# Patient Record
Sex: Female | Born: 1990 | Race: Black or African American | Hispanic: No | Marital: Single | Smoking: Former smoker
Health system: Southern US, Community
[De-identification: ages and names within clinical notes are randomized; demographics above are authoritative.]

## PROBLEM LIST (undated history)

## (undated) ENCOUNTER — Inpatient Hospital Stay (HOSPITAL_COMMUNITY): Payer: Self-pay

## (undated) DIAGNOSIS — R87629 Unspecified abnormal cytological findings in specimens from vagina: Secondary | ICD-10-CM

## (undated) HISTORY — PX: DILATION AND CURETTAGE OF UTERUS: SHX78

## (undated) HISTORY — PX: THERAPEUTIC ABORTION: SHX798

---
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
## 2010-07-01 LAB — URINALYSIS, ROUTINE W REFLEX MICROSCOPIC
Bilirubin Urine: NEGATIVE
Ketones, ur: NEGATIVE mg/dL
Nitrite: NEGATIVE
pH: 6 (ref 5.0–8.0)

## 2010-07-01 LAB — GC/CHLAMYDIA PROBE AMP, GENITAL: GC Probe Amp, Genital: NEGATIVE

## 2010-07-01 LAB — WET PREP, GENITAL
Trich, Wet Prep: NONE SEEN
Yeast Wet Prep HPF POC: NONE SEEN

## 2010-07-01 LAB — URINE MICROSCOPIC-ADD ON

## 2014-04-05 ENCOUNTER — Encounter (HOSPITAL_COMMUNITY): Payer: Self-pay | Admitting: Family Medicine

## 2014-04-05 ENCOUNTER — Emergency Department (HOSPITAL_COMMUNITY)
Admission: EM | Admit: 2014-04-05 | Discharge: 2014-04-05 | Disposition: A | Discharge status: Home / Self Care | Payer: No Typology Code available for payment source | Attending: Emergency Medicine | Admitting: Emergency Medicine

## 2014-04-05 DIAGNOSIS — Y9389 Activity, other specified: Secondary | ICD-10-CM | POA: Diagnosis not present

## 2014-04-05 DIAGNOSIS — S161XXA Strain of muscle, fascia and tendon at neck level, initial encounter: Secondary | ICD-10-CM | POA: Diagnosis not present

## 2014-04-05 DIAGNOSIS — Z720 Tobacco use: Secondary | ICD-10-CM | POA: Insufficient documentation

## 2014-04-05 DIAGNOSIS — S39012A Strain of muscle, fascia and tendon of lower back, initial encounter: Secondary | ICD-10-CM | POA: Diagnosis not present

## 2014-04-05 DIAGNOSIS — Y998 Other external cause status: Secondary | ICD-10-CM | POA: Insufficient documentation

## 2014-04-05 DIAGNOSIS — Y92410 Unspecified street and highway as the place of occurrence of the external cause: Secondary | ICD-10-CM | POA: Insufficient documentation

## 2014-04-05 DIAGNOSIS — S199XXA Unspecified injury of neck, initial encounter: Secondary | ICD-10-CM | POA: Diagnosis present

## 2014-04-05 MED ORDER — TRAMADOL HCL 50 MG PO TABS: 50.0000 mg | ORAL_TABLET | Freq: Four times a day (QID) | ORAL | Status: DC | PRN

## 2014-04-05 MED ORDER — TRAMADOL HCL 50 MG PO TABS
50.0000 mg | ORAL_TABLET | Freq: Once | ORAL | Status: AC
  Administered 2014-04-05: 50 mg via ORAL
  Filled 2014-04-05: qty 1

## 2014-04-05 MED ORDER — CYCLOBENZAPRINE HCL 10 MG PO TABS
10.0000 mg | ORAL_TABLET | Freq: Once | ORAL | Status: AC
  Administered 2014-04-05: 10 mg via ORAL
  Filled 2014-04-05: qty 1

## 2014-04-05 MED ORDER — CYCLOBENZAPRINE HCL 10 MG PO TABS: 10.0000 mg | ORAL_TABLET | Freq: Two times a day (BID) | ORAL | Status: DC | PRN
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
## 2014-04-05 NOTE — Discharge Instructions (Signed)
Take Tramadol as needed for pain. Take Flexeril as needed for muscle spasm. Refer to attached documents for more information.  °
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
## 2014-04-05 NOTE — ED Notes (Signed)
Declined W/C at D/C and was escorted to lobby by RN. 

## 2015-09-22 ENCOUNTER — Encounter: Payer: Self-pay | Admitting: Obstetrics and Gynecology

## 2015-09-22 ENCOUNTER — Ambulatory Visit (INDEPENDENT_AMBULATORY_CARE_PROVIDER_SITE_OTHER): Payer: Managed Care, Other (non HMO) | Admitting: Obstetrics and Gynecology

## 2015-09-22 VITALS — BP 100/56 | HR 84 | Resp 16 | Ht 67.5 in | Wt 134.0 lb

## 2015-09-22 DIAGNOSIS — Z124 Encounter for screening for malignant neoplasm of cervix: Secondary | ICD-10-CM

## 2015-09-22 DIAGNOSIS — Z23 Encounter for immunization: Secondary | ICD-10-CM

## 2015-09-22 DIAGNOSIS — Z113 Encounter for screening for infections with a predominantly sexual mode of transmission: Secondary | ICD-10-CM

## 2015-09-22 DIAGNOSIS — Z3009 Encounter for other general counseling and advice on contraception: Secondary | ICD-10-CM | POA: Diagnosis not present

## 2015-09-22 DIAGNOSIS — N9489 Other specified conditions associated with female genital organs and menstrual cycle: Secondary | ICD-10-CM | POA: Diagnosis not present

## 2015-09-22 DIAGNOSIS — Z01419 Encounter for gynecological examination (general) (routine) without abnormal findings: Secondary | ICD-10-CM | POA: Diagnosis not present

## 2015-09-22 DIAGNOSIS — N898 Other specified noninflammatory disorders of vagina: Secondary | ICD-10-CM

## 2015-09-22 DIAGNOSIS — E785 Hyperlipidemia, unspecified: Secondary | ICD-10-CM

## 2015-09-22 DIAGNOSIS — Z0000 Encounter for general adult medical examination without abnormal findings: Secondary | ICD-10-CM

## 2015-09-22 DIAGNOSIS — Z803 Family history of malignant neoplasm of breast: Secondary | ICD-10-CM | POA: Diagnosis not present

## 2015-09-22 LAB — LIPID PANEL
CHOL/HDL RATIO: 3 ratio (ref <=5.0)
CHOLESTEROL: 138 mg/dL (ref 125–200)
HDL: 46 mg/dL (ref >=46)
LDL Cholesterol: 77 mg/dL (ref <130)
Triglycerides: 75 mg/dL (ref <150)
VLDL: 15 mg/dL (ref <30)

## 2015-09-22 NOTE — Patient Instructions (Addendum)
Breast Self-Awareness Practicing breast self-awareness may pick up problems early, prevent significant medical complications, and possibly save your life. By practicing breast self-awareness, you can become familiar with how your breasts look and feel and if your breasts are changing. This allows you to notice changes early. It can also offer you some reassurance that your breast health is good. One way to learn what is normal for your breasts and whether your breasts are changing is to do a breast self-exam. If you find a lump or something that was not present in the past, it is best to contact your caregiver right away. Other findings that should be evaluated by your caregiver include nipple discharge, especially if it is bloody; skin changes or reddening; areas where the skin seems to be pulled in (retracted); or new lumps and bumps. Breast pain is seldom associated with cancer (malignancy), but should also be evaluated by a caregiver. HOW TO PERFORM A BREAST SELF-EXAM The best time to examine your breasts is 5-7 days after your menstrual period is over. During menstruation, the breasts are lumpier, and it may be more difficult to pick up changes. If you do not menstruate, have reached menopause, or had your uterus removed (hysterectomy), you should examine your breasts at regular intervals, such as monthly. If you are breastfeeding, examine your breasts after a feeding or after using a breast pump. Breast implants do not decrease the risk for lumps or tumors, so continue to perform breast self-exams as recommended. Talk to your caregiver about how to determine the difference between the implant and breast tissue. Also, talk about the amount of pressure you should use during the exam. Over time, you will become more familiar with the variations of your breasts and more comfortable with the exam. A breast self-exam requires you to remove all your clothes above the waist.  Look at your breasts and nipples.  Stand in front of a mirror in a room with good lighting. With your hands on your hips, push your hands firmly downward. Look for a difference in shape, contour, and size from one breast to the other (asymmetry). Asymmetry includes puckers, dips, or bumps. Also, look for skin changes, such as reddened or scaly areas on the breasts. Look for nipple changes, such as discharge, dimpling, repositioning, or redness.  Carefully feel your breasts. This is best done either in the shower or tub while using soapy water or when flat on your back. Place the arm (on the side of the breast you are examining) above your head. Use the pads (not the fingertips) of your three middle fingers on your opposite hand to feel your breasts. Start in the underarm area and use  inch (2 cm) overlapping circles to feel your breast. Use 3 different levels of pressure (light, medium, and firm pressure) at each circle before moving to the next circle. The light pressure is needed to feel the tissue closest to the skin. The medium pressure will help to feel breast tissue a little deeper, while the firm pressure is needed to feel the tissue close to the ribs. Continue the overlapping circles, moving downward over the breast until you feel your ribs below your breast. Then, move one finger-width towards the center of the body. Continue to use the  inch (2 cm) overlapping circles to feel your breast as you move slowly up toward the collar bone (clavicle) near the base of the neck. Continue the up and down exam using all 3 pressures until you reach the  middle of the chest. Do this with each breast, carefully feeling for lumps or changes.   Keep a written record with breast changes or normal findings for each breast. By writing this information down, you do not need to depend only on memory for size, tenderness, or location. Write down where you are in your menstrual cycle, if you are still menstruating. Breast tissue can have some lumps or thick  tissue. However, see your caregiver if you find anything that concerns you.  SEEK MEDICAL CARE IF:  You see a change in shape, contour, or size of your breasts or nipples.   You see skin changes, such as reddened or scaly areas on the breasts or nipples.   You have an unusual discharge from your nipples.   You feel a new lump or unusually thick areas.    This information is not intended to replace advice given to you by your health care provider. Make sure you discuss any questions you have with your health care provider.   Document Released: 04/02/2005 Document Revised: 03/19/2012 Document Reviewed: 07/18/2011 Elsevier Interactive Patient Education 2016 [employer]. Medroxyprogesterone injection [Contraceptive] What is this medicine? MEDROXYPROGESTERONE (me DROX ee proe JES te rone) contraceptive injections prevent pregnancy. They provide effective birth control for 3 months. Depo-subQ Provera 104 is also used for treating pain related to endometriosis. This medicine may be used for other purposes; ask your health care provider or pharmacist if you have questions. What should I tell my health care provider before I take this medicine? They need to know if you have any of these conditions: -frequently drink alcohol -asthma -blood vessel disease or a history of a blood clot in the lungs or legs -bone disease such as osteoporosis -breast cancer -diabetes -eating disorder (anorexia nervosa or bulimia) -high blood pressure -HIV infection or AIDS -kidney disease -liver disease -mental depression -migraine -seizures (convulsions) -stroke -tobacco smoker -vaginal bleeding -an unusual or allergic reaction to medroxyprogesterone, other hormones, medicines, foods, dyes, or preservatives -pregnant or trying to get pregnant -breast-feeding How should I use this medicine? Depo-Provera Contraceptive injection is given into a muscle. Depo-subQ Provera 104 injection is given under  the skin. These injections are given by a health care professional. You must not be pregnant before getting an injection. The injection is usually given during the first 5 days after the start of a menstrual period or 6 weeks after delivery of a baby. Talk to your pediatrician regarding the use of this medicine in children. Special care may be needed. These injections have been used in female children who have started having menstrual periods. Overdosage: If you think you have taken too much of this medicine contact a poison control center or emergency room at once. NOTE: This medicine is only for you. Do not share this medicine with others. What if I miss a dose? Try not to miss a dose. You must get an injection once every 3 months to maintain birth control. If you cannot keep an appointment, call and reschedule it. If you wait longer than 13 weeks between Depo-Provera contraceptive injections or longer than 14 weeks between Depo-subQ Provera 104 injections, you could get pregnant. Use another method for birth control if you miss your appointment. You may also need a pregnancy test before receiving another injection. What may interact with this medicine? Do not take this medicine with any of the following medications: -bosentan This medicine may also interact with the following medications: -aminoglutethimide -antibiotics or medicines for infections, especially rifampin,  rifabutin, rifapentine, and griseofulvin -aprepitant -barbiturate medicines such as phenobarbital or primidone -bexarotene -carbamazepine -medicines for seizures like ethotoin, felbamate, oxcarbazepine, phenytoin, topiramate -modafinil -St. John's wort This list may not describe all possible interactions. Give your health care provider a list of all the medicines, herbs, non-prescription drugs, or dietary supplements you use. Also tell them if you smoke, drink alcohol, or use illegal drugs. Some items may interact with your  medicine. What should I watch for while using this medicine? This drug does not protect you against HIV infection (AIDS) or other sexually transmitted diseases. Use of this product may cause you to lose calcium from your bones. Loss of calcium may cause weak bones (osteoporosis). Only use this product for more than 2 years if other forms of birth control are not right for you. The longer you use this product for birth control the more likely you will be at risk for weak bones. Ask your health care professional how you can keep strong bones. You may have a change in bleeding pattern or irregular periods. Many females stop having periods while taking this drug. If you have received your injections on time, your chance of being pregnant is very low. If you think you may be pregnant, see your health care professional as soon as possible. Tell your health care professional if you want to get pregnant within the next year. The effect of this medicine may last a long time after you get your last injection. What side effects may I notice from receiving this medicine? Side effects that you should report to your doctor or health care professional as soon as possible: -allergic reactions like skin rash, itching or hives, swelling of the face, lips, or tongue -breast tenderness or discharge -breathing problems -changes in vision -depression -feeling faint or lightheaded, falls -fever -pain in the abdomen, chest, groin, or leg -problems with balance, talking, walking -unusually weak or tired -yellowing of the eyes or skin Side effects that usually do not require medical attention (report to your doctor or health care professional if they continue or are bothersome): -acne -fluid retention and swelling -headache -irregular periods, spotting, or absent periods -temporary pain, itching, or skin reaction at site where injected -weight gain This list may not describe all possible side effects. Call your  doctor for medical advice about side effects. You may report side effects to FDA at 1-800-FDA-1088. Where should I keep my medicine? This does not apply. The injection will be given to you by a health care professional. NOTE: This sheet is a summary. It may not cover all possible information. If you have questions about this medicine, talk to your doctor, pharmacist, or health care provider.    2016, Elsevier/Gold Standard. (2008-04-23 18:37:56) Bacterial Vaginosis Bacterial vaginosis is a vaginal infection that occurs when the normal balance of bacteria in the vagina is disrupted. It results from an overgrowth of certain bacteria. This is the most common vaginal infection in women of childbearing age. Treatment is important to prevent complications, especially in pregnant women, as it can cause a premature delivery. CAUSES  Bacterial vaginosis is caused by an increase in harmful bacteria that are normally present in smaller amounts in the vagina. Several different kinds of bacteria can cause bacterial vaginosis. However, the reason that the condition develops is not fully understood. RISK FACTORS Certain activities or behaviors can put you at an increased risk of developing bacterial vaginosis, including:  Having a new sex partner or multiple sex partners.  Douching.  Using  an intrauterine device (IUD) for contraception. Women do not get bacterial vaginosis from toilet seats, bedding, swimming pools, or contact with objects around them. SIGNS AND SYMPTOMS  Some women with bacterial vaginosis have no signs or symptoms. Common symptoms include:  Grey vaginal discharge.  A fishlike odor with discharge, especially after sexual intercourse.  Itching or burning of the vagina and vulva.  Burning or pain with urination. DIAGNOSIS  Your health care provider will take a medical history and examine the vagina for signs of bacterial vaginosis. A sample of vaginal fluid may be taken. Your health  care provider will look at this sample under a microscope to check for bacteria and abnormal cells. A vaginal pH test may also be done.  TREATMENT  Bacterial vaginosis may be treated with antibiotic medicines. These may be given in the form of a pill or a vaginal cream. A second round of antibiotics may be prescribed if the condition comes back after treatment. Because bacterial vaginosis increases your risk for sexually transmitted diseases, getting treated can help reduce your risk for chlamydia, gonorrhea, HIV, and herpes. HOME CARE INSTRUCTIONS   Only take over-the-counter or prescription medicines as directed by your health care provider.  If antibiotic medicine was prescribed, take it as directed. Make sure you finish it even if you start to feel better.  Tell all sexual partners that you have a vaginal infection. They should see their health care provider and be treated if they have problems, such as a mild rash or itching.  During treatment, it is important that you follow these instructions:  Avoid sexual activity or use condoms correctly.  Do not douche.  Avoid alcohol as directed by your health care provider.  Avoid breastfeeding as directed by your health care provider. SEEK MEDICAL CARE IF:   Your symptoms are not improving after 3 days of treatment.  You have increased discharge or pain.  You have a fever. MAKE SURE YOU:   Understand these instructions.  Will watch your condition.  Will get help right away if you are not doing well or get worse. FOR MORE INFORMATION  Centers for Disease Control and Prevention, Division of STD Prevention: SolutionApps.co.zawww.cdc.gov/std American Sexual Health Association (ASHA): www.ashastd.org    This information is not intended to replace advice given to you by your health care provider. Make sure you discuss any questions you have with your health care provider.   Document Released: 04/02/2005 Document Revised: 04/23/2014 Document Reviewed:  11/12/2012 Elsevier Interactive Patient Education [employer]2016 Elsevier Inc.

## 2015-09-22 NOTE — Progress Notes (Signed)
Patient ID: Brooke Vance, female   DOB: Feb 21, 1991, 25 y.o.   MRN: [id number] 25 y.o. G1P0010 Single African AmericanF here for annual exam.  She c/o a vaginal odor since last week. Slight itching, no burning or irritation.  Period Duration (Days): 5 days  Period Pattern: Regular Menstrual Flow: Moderate Menstrual Control: Maxi pad, Tampon Dysmenorrhea: None  Saturates a pad in 3 hours. Rare BTB, spotting. Same partner for over 1.5 years, sometimes uses condoms. She was on OCP's in the past, did fine on them.  She does report a h/o having elevated lipids in the past, no f/u.  Patient's last menstrual period was 09/01/2015.          Sexually active: Yes.    The current method of family planning is condoms sometimes.    Exercising: No.  The patient does not participate in regular exercise at present. Smoker:  yes  Health Maintenance: Pap:  2014 abnormal per patient  History of abnormal Pap:  Yes, f/u was fine. TDaP:  unsure Gardasil: unsure, thinks she had 2 of them   reports that she has been smoking Cigarettes.  She has been smoking about 0.25 packs per day. She has never used smokeless tobacco. She reports that she drinks about 2.4 oz of alcohol per week. She reports that she does not use illicit drugs.She is an Primary school teacher. Lives with her boyfriend.   History reviewed. No pertinent past medical history.  Past Surgical History  Procedure Laterality Date  . Dilation and curettage of uterus    TAB was 3 years ago.   No current outpatient prescriptions on file.   No current facility-administered medications for this visit.    Family History  Problem Relation Age of Onset  . Breast cancer Mother 58    BRCA negative. Doing fine  . Heart disease Paternal Grandmother     Review of Systems  Constitutional: Negative.   HENT: Negative.   Eyes: Negative.   Respiratory: Negative.   Cardiovascular: Negative.   Gastrointestinal: Negative.   Endocrine: Negative.    Genitourinary: Negative.   Musculoskeletal: Negative.   Skin: Negative.   Allergic/Immunologic: Negative.   Neurological: Negative.   Psychiatric/Behavioral: Negative.     Exam:   BP 100/56 mmHg  Pulse 84  Resp 16  Ht 5' 7.5" (1.715 m)  Wt 134 lb (60.782 kg)  BMI 20.67 kg/m2  LMP 09/01/2015  Weight change: @WEIGHTCHANGE @ Height:   Height: 5' 7.5" (171.5 cm)  Ht Readings from Last 3 Encounters:  09/22/15 5' 7.5" (1.715 m)  04/05/14 5' 7"  (1.702 m)    General appearance: alert, cooperative and appears stated age Head: Normocephalic, without obvious abnormality, atraumatic Neck: no adenopathy, supple, symmetrical, trachea midline and thyroid normal to inspection and palpation Lungs: clear to auscultation bilaterally Breasts: normal appearance, no masses or tenderness Heart: regular rate and rhythm Abdomen: soft, non-tender; bowel sounds normal; no masses,  no organomegaly Extremities: extremities normal, atraumatic, no cyanosis or edema Skin: Skin color, texture, turgor normal. No rashes or lesions Lymph nodes: Cervical, supraclavicular, and axillary nodes normal. No abnormal inguinal nodes palpated Neurologic: Grossly normal   Pelvic: External genitalia:  no lesions              Urethra:  normal appearing urethra with no masses, tenderness or lesions              Bartholins and Skenes: normal  Vagina: normal appearing vagina with an increased watery vaginal discharge              Cervix: no lesions               Bimanual Exam:  Uterus:  normal size, contour, position, consistency, mobility, non-tender              Adnexa: no mass, fullness, tenderness               Rectovaginal: Confirms               Anus:  normal sphincter tone, no lesions  Chaperone was present for exam.  A:  Well Woman with normal exam   Contraception, discussed options of contraception, she is interested in depoprovera. Discussed side effects of Depo-provera, including bleeding,  bone loss and weight gain  STD testing  Family history of breast cancer. Mother with breast cancer at 65, negative BRCA testing  Vaginal odor, increased d/c on exam, suspect BV  P:   Pap  STD testing  Lipid panel, vit D  Discussed breast self exam  Discussed calcium and vit D intake  Consider genetic counseling   Start depo-provera with her next cycle, 150 mg IM q 12 weeks  She will check on her gardasil vaccinations. If needs more will do at her f/u visit for depo-provera

## 2015-09-23 ENCOUNTER — Telehealth: Payer: Self-pay

## 2015-09-23 DIAGNOSIS — R7989 Other specified abnormal findings of blood chemistry: Secondary | ICD-10-CM

## 2015-09-23 LAB — WET PREP BY MOLECULAR PROBE
Candida species: NEGATIVE
Gardnerella vaginalis: POSITIVE — AB
Trichomonas vaginosis: NEGATIVE

## 2015-09-23 LAB — STD PANEL
HEP B S AG: NEGATIVE
HIV 1&2 Ab, 4th Generation: NONREACTIVE

## 2015-09-23 LAB — HEPATITIS C ANTIBODY: HCV Ab: NEGATIVE

## 2015-09-23 LAB — VITAMIN D 25 HYDROXY (VIT D DEFICIENCY, FRACTURES): VIT D 25 HYDROXY: 12 ng/mL — AB (ref 30–100)

## 2015-09-23 LAB — IPS PAP SMEAR ONLY

## 2015-09-23 MED ORDER — VITAMIN D (ERGOCALCIFEROL) 1.25 MG (50000 UNIT) PO CAPS: 50000.0000 [IU] | ORAL_CAPSULE | ORAL | Status: DC

## 2015-09-23 MED ORDER — METRONIDAZOLE 0.75 % VA GEL: 1.0000 | Freq: Every day | VAGINAL | Status: DC

## 2015-09-23 NOTE — Telephone Encounter (Signed)
Spoke with patient. Advised of results and message as seen below from Dr.Jertson. She is agreeable and verbalizes understanding. She would like to use Metrogel at this time. Rx for Metrogel 1 applicator per vagina q day x 5 days and Vitamin D 50,000 IU take 1 tablet every 7 days #12 0RF sent to pharmacy on file. She is agreeable. 3 month lab recheck scheduled for 12/27/2015 at 8:30 am. She is agreeable to date and time.  Routing to provider for final review. Patient agreeable to disposition. Will close encounter.

## 2015-09-23 NOTE — Telephone Encounter (Signed)
-----   Message from Romualdo BolkJill Evelyn Jertson, MD sent at 09/23/2015  9:00 AM EDT ----- Please inform the patient that her vaginitis probe was + for BV and treat with flagyl (either oral or vaginal, her choice), no ETOH while on Flagyl.  Oral: Flagyl 500 mg BID x 7 days, or Vaginal: Metrogel, 1 applicator per vagina q day x 5 days. She also has a very low vit D. Please call in 50,000 IU of vit d q week x 12 weeks, then she should have another vit D level checked.  The rest of her blood work was normal. Her GC/Chlamydia culture and pap smear are still pending.

## 2015-09-26 LAB — IPS N GONORRHOEA AND CHLAMYDIA BY PCR

## 2015-12-12 ENCOUNTER — Other Ambulatory Visit: Payer: Self-pay | Admitting: Obstetrics and Gynecology

## 2015-12-12 NOTE — Telephone Encounter (Signed)
Medication refill request: Vitamin D 50,000 Last AEX:  09-22-15  Next AEX: 09-26-16 Last MMG (if hormonal medication request): N/A Refill authorized: Please advise- patient is scheduled for 12-27-15 for Vitamin D recheck-eh

## 2015-12-12 NOTE — Telephone Encounter (Signed)
Patients phone does not allow for voice mail-eh

## 2015-12-12 NOTE — Telephone Encounter (Signed)
I would not fill her vit D script until she has a f/u vit d level. It sounds like she has an appointment  In several weeks, see if we can move that up. Thanks

## 2015-12-27 ENCOUNTER — Ambulatory Visit (INDEPENDENT_AMBULATORY_CARE_PROVIDER_SITE_OTHER): Payer: Managed Care, Other (non HMO)

## 2015-12-27 ENCOUNTER — Other Ambulatory Visit (INDEPENDENT_AMBULATORY_CARE_PROVIDER_SITE_OTHER): Payer: Managed Care, Other (non HMO)

## 2015-12-27 VITALS — BP 114/78 | HR 64 | Resp 14 | Ht 67.5 in | Wt 133.6 lb

## 2015-12-27 DIAGNOSIS — E559 Vitamin D deficiency, unspecified: Secondary | ICD-10-CM

## 2015-12-27 DIAGNOSIS — Z01812 Encounter for preprocedural laboratory examination: Secondary | ICD-10-CM | POA: Diagnosis not present

## 2015-12-27 LAB — POCT URINE PREGNANCY: Preg Test, Ur: NEGATIVE

## 2015-12-27 NOTE — Progress Notes (Signed)
Patient here for UPT prior to starting Depo-Provera. Patient is on her cycle today.

## 2015-12-28 ENCOUNTER — Telehealth: Payer: Self-pay | Admitting: *Deleted

## 2015-12-28 DIAGNOSIS — E559 Vitamin D deficiency, unspecified: Secondary | ICD-10-CM

## 2015-12-28 LAB — VITAMIN D 25 HYDROXY (VIT D DEFICIENCY, FRACTURES): Vit D, 25-Hydroxy: 26 ng/mL — ABNORMAL LOW (ref 30–100)

## 2015-12-28 NOTE — Telephone Encounter (Signed)
-----   Message from Romualdo BolkJill Evelyn Jertson, MD sent at 12/28/2015 12:58 PM EDT ----- Please inform the patient that her vit d is still low, but much better. I would recommend she start taking 2,000 IU of vit D 3 daily (over the counter) and that we recheck her vit D level in another 3 months (please order and set up). Thanks

## 2016-01-10 ENCOUNTER — Ambulatory Visit: Payer: Managed Care, Other (non HMO)

## 2016-01-10 NOTE — Progress Notes (Deleted)
Patient is here for Depo Provera Injection Patient is within Depo Provera Calender Limits n/a Next Depo Due between: 12/12 - 12/26 Last AEX: 09/22/15 JJ AEX Scheduled: 09/26/16 JJ  Patient is aware when next depo is due  Pt tolerated Injection well. Injection given in   Routed to provider for review, encounter closed.

## 2016-03-07 ENCOUNTER — Telehealth: Payer: Self-pay | Admitting: Obstetrics and Gynecology

## 2016-03-07 DIAGNOSIS — Z3201 Encounter for pregnancy test, result positive: Secondary | ICD-10-CM

## 2016-03-07 DIAGNOSIS — N912 Amenorrhea, unspecified: Secondary | ICD-10-CM

## 2016-03-07 NOTE — Telephone Encounter (Signed)
Patient just learned that she is pregnant and like an appointment for confirmation.

## 2016-03-07 NOTE — Telephone Encounter (Signed)
Can we set her up for an ultrasound that day?

## 2016-03-07 NOTE — Telephone Encounter (Signed)
Spoke with patient. Patient is agreeable to schedule viability ultrasound. Appointment changed to viability ultrasound at 3:30 pm with 4 pm consult with Dr.Jertson on 03/13/2016. Patient is agreeable and verbalizes understanding. Order placed for precert.  Routing to provider for final review. Patient agreeable to disposition. Will close encounter.

## 2016-03-07 NOTE — Addendum Note (Signed)
Addended by: Michele McalpineHINES, KAITLYN E on: 03/07/2016 02:00 PM   Modules accepted: Orders

## 2016-03-07 NOTE — Telephone Encounter (Signed)
Spoke with patient. Patient's LMP was 01/26/2016. Took UPT that was positive. Would like to be seen for confirmation. Appointment scheduled for 03/13/2016 at 3:30 pm with Dr.Jertson. Patient is agreeable to date and time.  Routing to provider for final review. Patient agreeable to disposition. Will close encounter.

## 2016-03-13 ENCOUNTER — Ambulatory Visit (INDEPENDENT_AMBULATORY_CARE_PROVIDER_SITE_OTHER): Payer: Managed Care, Other (non HMO)

## 2016-03-13 ENCOUNTER — Ambulatory Visit (INDEPENDENT_AMBULATORY_CARE_PROVIDER_SITE_OTHER): Payer: Managed Care, Other (non HMO) | Admitting: Obstetrics and Gynecology

## 2016-03-13 ENCOUNTER — Ambulatory Visit: Payer: Managed Care, Other (non HMO) | Admitting: Obstetrics and Gynecology

## 2016-03-13 ENCOUNTER — Encounter: Payer: Self-pay | Admitting: Obstetrics and Gynecology

## 2016-03-13 VITALS — BP 110/60 | HR 80 | Resp 14 | Wt 130.0 lb

## 2016-03-13 DIAGNOSIS — R112 Nausea with vomiting, unspecified: Secondary | ICD-10-CM | POA: Diagnosis not present

## 2016-03-13 DIAGNOSIS — Z3201 Encounter for pregnancy test, result positive: Secondary | ICD-10-CM | POA: Diagnosis not present

## 2016-03-13 DIAGNOSIS — N912 Amenorrhea, unspecified: Secondary | ICD-10-CM

## 2016-03-13 DIAGNOSIS — O30041 Twin pregnancy, dichorionic/diamniotic, first trimester: Secondary | ICD-10-CM | POA: Diagnosis not present

## 2016-03-13 MED ORDER — PROMETHAZINE HCL 25 MG RE SUPP: 25.0000 mg | Freq: Four times a day (QID) | RECTAL | 0 refills | Status: DC | PRN

## 2016-03-13 NOTE — Progress Notes (Signed)
GYNECOLOGY  VISIT   HPI: 25 y.o.   Single  African American  female   G2P0010 with Patient's last menstrual period was 12/25/2015.   here for viability U/S. Patient c/o nausea and vomiting. Today vomited 4 x's, having a hard time keeping anything down.       GYNECOLOGIC HISTORY: Patient's last menstrual period was 12/25/2015. Contraception:none  Menopausal hormone therapy: none        OB History    Gravida Para Term Preterm AB Living   2       1     SAB TAB Ectopic Multiple Live Births                     There are no active problems to display for this patient.   No past medical history on file.  Past Surgical History:  Procedure Laterality Date  . DILATION AND CURETTAGE OF UTERUS     abortion     No current outpatient prescriptions on file.   No current facility-administered medications for this visit.      ALLERGIES: Patient has no known allergies.  Family History  Problem Relation Age of Onset  . Breast cancer Mother 65    BRCA negative. Doing fine  . Heart disease Paternal Grandmother     Social History   Social History  . Marital status: Single    Spouse name: N/A  . Number of children: N/A  . Years of education: N/A   Occupational History  . Not on file.   Social History Main Topics  . Smoking status: Current Every Day Smoker    Packs/day: 0.25    Types: Cigarettes  . Smokeless tobacco: Never Used  . Alcohol use 2.4 oz/week    4 Standard drinks or equivalent per week  . Drug use: No  . Sexual activity: Yes    Partners: Male   Other Topics Concern  . Not on file   Social History Narrative  . No narrative on file    Review of Systems  Constitutional: Negative.   HENT: Negative.   Eyes: Negative.   Respiratory: Negative.   Cardiovascular: Negative.   Gastrointestinal: Positive for nausea.  Genitourinary: Negative.   Musculoskeletal: Negative.   Skin: Negative.   Neurological: Negative.   Endo/Heme/Allergies:  Negative.   Psychiatric/Behavioral: Negative.     PHYSICAL EXAMINATION:    BP 110/60 (BP Location: Right Arm, Patient Position: Sitting, Cuff Size: Normal)   Pulse 80   Resp 14   Wt 130 lb (59 kg)   LMP 12/25/2015   BMI 20.06 kg/m     General appearance: alert, cooperative and appears stated age  ASSESSMENT Twin IUP, di/di N/V in pregnacy    PLAN Discussed N/V in pregnancy Try unisom and vit B6 Phenergan as back up  Needs to go to the ER at [facility] if she still can't tolerate PO Discussed PNV She will establish care with OB    An After Visit Summary was printed and given to the patient.  15 minutes face to face time of which over 50% was spent in counseling.

## 2016-03-13 NOTE — Patient Instructions (Signed)
Try vit B6 and unisom  Eating Plan for Hyperemesis Gravidarum Hyperemesis gravidarum is a severe form of morning sickness. Because this condition causes severe nausea and vomiting, it can lead to dehydration, malnutrition, and weight loss. One way to lessen the symptoms of nausea and vomiting is to follow the eating plan for hyperemesis gravidarum. It is often used along with prescribed medicines to control your symptoms. What can I do to relieve my symptoms? Listen to your body. Everyone is different and has different preferences. Find what works best for you. Take any of the following actions that are helpful to you:  Eat and drink slowly.  Eat 5-6 small meals daily instead of 3 large meals.  Eat crackers before you get out of bed in the morning.  Try having a snack in the middle of the night.  Starchy foods are usually tolerated well. Examples include cereal, toast, bread, potatoes, pasta, rice, and pretzels.  Ginger may help with nausea. Add  tsp ground ginger to hot tea or choose ginger tea.  Try drinking 100% fruit juice or an electrolyte drink. An electrolyte drink contains sodium, potassium, and chloride.  Continue to take your prenatal vitamins as told by your health care provider. If you are having trouble taking your prenatal vitamins, talk with your health care provider about different options.  Include at least 1 serving of protein with your meals and snacks. Protein options include meats or poultry, beans, nuts, eggs, and yogurt. Try eating a protein-rich snack before bed. Examples of these snacks include cheese and crackers or half of a peanut butter or [place] sandwich.  Consider eliminating foods that trigger your symptoms. These may include spicy foods, coffee, high-fat foods, very sweet foods, and acidic foods.  Try meals that have more protein combined with bland, salty, lower-fat, and dry foods, such as nuts, seeds, pretzels, crackers, and cereal.  Talk with your  healthcare provider about starting a supplement of vitamin B6.  Have fluids that are cold, clear, and carbonated or sour. Examples include lemonade, ginger ale, lemon-lime soda, ice water, and sparkling water.  Try lemon or mint tea.  Try brushing your teeth or using a mouth rinse after meals. What should I avoid to reduce my symptoms? Avoiding some of the following things may help reduce your symptoms.  Foods with strong smells. Try eating meals in well-ventilated areas that are free of odors.  Drinking water or other beverages with meals. Try not to drink anything during the 30 minutes before and after your meals.  Drinking more than 1 cup of fluid at a time. Sometimes using a straw helps.  Fried or high-fat foods, such as butter and cream sauces.  Spicy foods.  Skipping meals as best as you can. Nausea can be more intense on an empty stomach. If you cannot tolerate food at that time, do not force it. Try sucking on ice chips or other frozen items, and make up for missed calories later.  Lying down within 2 hours after eating.  Environmental triggers. These may include smoky rooms, closed spaces, rooms with strong smells, warm or humid places, overly loud and noisy rooms, and rooms with motion or flickering lights.  Quick and sudden changes in your movement. This information is not intended to replace advice given to you by your health care provider. Make sure you discuss any questions you have with your health care provider. Document Released: 01/28/2007 Document Revised: 11/30/2015 Document Reviewed: 11/01/2015 Elsevier Interactive Patient Education  2017 [employer]

## 2016-03-23 LAB — OB RESULTS CONSOLE GC/CHLAMYDIA: GC PROBE AMP, GENITAL: NEGATIVE

## 2016-03-26 LAB — OB RESULTS CONSOLE RUBELLA ANTIBODY, IGM: Rubella: IMMUNE

## 2016-03-26 LAB — OB RESULTS CONSOLE RPR: RPR: NONREACTIVE

## 2016-03-26 LAB — OB RESULTS CONSOLE HEPATITIS B SURFACE ANTIGEN: HEP B S AG: NEGATIVE

## 2016-03-26 LAB — OB RESULTS CONSOLE HIV ANTIBODY (ROUTINE TESTING): HIV: NONREACTIVE

## 2016-03-28 ENCOUNTER — Telehealth: Payer: Self-pay | Admitting: Obstetrics and Gynecology

## 2016-03-28 ENCOUNTER — Other Ambulatory Visit: Payer: Self-pay

## 2016-03-28 NOTE — Telephone Encounter (Signed)
Called patient to reschedule lab appointment from this morning. Patient is pregnant and is currently seeing an obgyn.

## 2016-04-12 ENCOUNTER — Encounter (HOSPITAL_COMMUNITY): Payer: Self-pay | Admitting: *Deleted

## 2016-04-12 ENCOUNTER — Inpatient Hospital Stay (HOSPITAL_COMMUNITY)
Admission: AD | Admit: 2016-04-12 | Discharge: 2016-04-12 | Disposition: A | Discharge status: Home / Self Care | Payer: Managed Care, Other (non HMO) | Source: Ambulatory Visit | Attending: Obstetrics and Gynecology | Admitting: Obstetrics and Gynecology

## 2016-04-12 DIAGNOSIS — Z803 Family history of malignant neoplasm of breast: Secondary | ICD-10-CM | POA: Insufficient documentation

## 2016-04-12 DIAGNOSIS — O30041 Twin pregnancy, dichorionic/diamniotic, first trimester: Secondary | ICD-10-CM | POA: Diagnosis not present

## 2016-04-12 DIAGNOSIS — Z87891 Personal history of nicotine dependence: Secondary | ICD-10-CM | POA: Diagnosis not present

## 2016-04-12 DIAGNOSIS — O30049 Twin pregnancy, dichorionic/diamniotic, unspecified trimester: Secondary | ICD-10-CM | POA: Diagnosis not present

## 2016-04-12 DIAGNOSIS — O219 Vomiting of pregnancy, unspecified: Secondary | ICD-10-CM | POA: Insufficient documentation

## 2016-04-12 DIAGNOSIS — Z3A11 11 weeks gestation of pregnancy: Secondary | ICD-10-CM | POA: Diagnosis not present

## 2016-04-12 DIAGNOSIS — R112 Nausea with vomiting, unspecified: Secondary | ICD-10-CM | POA: Diagnosis present

## 2016-04-12 HISTORY — DX: Unspecified abnormal cytological findings in specimens from vagina: R87.629

## 2016-04-12 LAB — URINALYSIS, ROUTINE W REFLEX MICROSCOPIC
Bilirubin Urine: NEGATIVE
Glucose, UA: NEGATIVE mg/dL
HGB URINE DIPSTICK: NEGATIVE
Ketones, ur: 80 mg/dL — AB
Leukocytes, UA: NEGATIVE
Nitrite: NEGATIVE
PROTEIN: 30 mg/dL — AB
Specific Gravity, Urine: 1.026 (ref 1.005–1.030)
pH: 6 (ref 5.0–8.0)

## 2016-04-12 MED ORDER — PANTOPRAZOLE SODIUM 40 MG IV SOLR
40.0000 mg | Freq: Once | INTRAVENOUS | Status: AC
  Administered 2016-04-12: 40 mg via INTRAVENOUS
  Filled 2016-04-12: qty 40

## 2016-04-12 MED ORDER — DEXTROSE IN LACTATED RINGERS 5 % IV SOLN
25.0000 mg | Freq: Once | INTRAVENOUS | Status: AC
  Administered 2016-04-12: 25 mg via INTRAVENOUS
  Filled 2016-04-12: qty 1

## 2016-04-12 MED ORDER — PROMETHAZINE HCL 25 MG RE SUPP: 25.0000 mg | Freq: Four times a day (QID) | RECTAL | 1 refills | Status: DC | PRN

## 2016-04-12 MED ORDER — LACTATED RINGERS IV BOLUS (SEPSIS)
1000.0000 mL | Freq: Once | INTRAVENOUS | Status: AC
  Administered 2016-04-12: 1000 mL via INTRAVENOUS

## 2016-04-12 NOTE — MAU Provider Note (Signed)
Chief Complaint: No chief complaint on file.   First Provider Initiated Contact with Patient 04/12/16 1955     SUBJECTIVE HPI: Brooke Vance is a 25 y.o. G2P0010 at 80w4dwho presents to Maternity Admissions reporting exacerbation of nausea and vomiting of pregnancy. Said nausea and vomiting throughout pregnancy, but symptoms have been adequately controlled with Diclegis and Phenergan up until this point, but today she vomited 8-10 times and couldn't keep anything down including medications.   Associated signs and symptoms: Negative for fever, chills, diarrhea, abdominal pain or sick contacts.  Past Medical History:  Diagnosis Date  . Vaginal Pap smear, abnormal    OB History  Gravida Para Term Preterm AB Living  2       1    SAB TAB Ectopic Multiple Live Births               # Outcome Date GA Lbr Len/2nd Weight Sex Delivery Anes PTL Lv  2 Current           1 AB              Past Surgical History:  Procedure Laterality Date  . DILATION AND CURETTAGE OF UTERUS     abortion   . THERAPEUTIC ABORTION     Social History   Social History  . Marital status: Single    Spouse name: N/A  . Number of children: N/A  . Years of education: N/A   Occupational History  . Not on file.   Social History Main Topics  . Smoking status: Former Smoker    Packs/day: 0.25    Types: Cigarettes    Quit date: 03/29/2016  . Smokeless tobacco: Never Used  . Alcohol use 2.4 oz/week    4 Standard drinks or equivalent per week     Comment: not while preg  . Drug use: No  . Sexual activity: Yes    Partners: Male     Comment: last sex Dec 25   Other Topics Concern  . Not on file   Social History Narrative  . No narrative on file   Family History  Problem Relation Age of Onset  . Breast cancer Mother 337   BRCA negative. Doing fine  . Heart disease Paternal Grandmother    No current facility-administered medications on file prior to encounter.    No current outpatient  prescriptions on file prior to encounter.   No Known Allergies  I have reviewed patient's Past Medical Hx, Surgical Hx, Family Hx, Social Hx, medications and allergies.   Review of Systems  Constitutional: Negative for chills and fever.  Gastrointestinal: Positive for nausea and vomiting. Negative for abdominal pain, constipation and diarrhea.  Genitourinary: Negative for dysuria, flank pain and vaginal bleeding.  Neurological: Negative for dizziness.    OBJECTIVE Patient Vitals for the past 24 hrs:  BP Temp Temp src Pulse Resp  04/12/16 2228 127/73 98.6 F (37 C) Oral 101 18   Constitutional: Well-developed, well-nourished female in mild distress. Actively vomiting.  Cardiovascular: normal rate Respiratory: normal rate and effort.  GI: Abd soft, non-tender. MS: Extremities nontender, no edema, normal ROM Neurologic: Alert and oriented x 4.  GU: Neg CVAT.  LAB RESULTS Results for orders placed or performed during the hospital encounter of 04/12/16 (from the past 24 hour(s))  Urinalysis, Routine w reflex microscopic     Status: Abnormal   Collection Time: 04/12/16  6:30 PM  Result Value Ref Range   Color, Urine YELLOW YELLOW  APPearance HAZY (A) CLEAR   Specific Gravity, Urine 1.026 1.005 - 1.030   pH 6.0 5.0 - 8.0   Glucose, UA NEGATIVE NEGATIVE mg/dL   Hgb urine dipstick NEGATIVE NEGATIVE   Bilirubin Urine NEGATIVE NEGATIVE   Ketones, ur 80 (A) NEGATIVE mg/dL   Protein, ur 30 (A) NEGATIVE mg/dL   Nitrite NEGATIVE NEGATIVE   Leukocytes, UA NEGATIVE NEGATIVE   RBC / HPF 0-5 0 - 5 RBC/hpf   WBC, UA 0-5 0 - 5 WBC/hpf   Bacteria, UA RARE (A) NONE SEEN   Squamous Epithelial / LPF 6-30 (A) NONE SEEN   Mucous PRESENT     IMAGING No results found.  MAU COURSE Orders Placed This Encounter  Procedures  . Urinalysis, Routine w reflex microscopic  . Insert peripheral IV  . Discharge patient   Meds ordered this encounter  Medications  . folic acid (FOLVITE) 1 MG  tablet    Sig: Take 2 mg by mouth daily.     Refill:  11  . promethazine (PHENERGAN) 25 MG tablet    Sig: Take 25 mg by mouth every 6 (six) hours as needed for nausea or vomiting.     Refill:  1  . Prenatal Vit-Fe Fumarate-FA (PRENATAL MULTIVITAMIN) TABS tablet    Sig: Take 1 tablet by mouth daily at 12 noon.  . promethazine (PHENERGAN) 25 mg in dextrose 5% lactated ringers 1,000 mL infusion  . pantoprazole (PROTONIX) injection 40 mg  . lactated ringers bolus 1,000 mL  . promethazine (PHENERGAN) 25 MG suppository    Sig: Place 1 suppository (25 mg total) rectally every 6 (six) hours as needed for nausea or vomiting.    Dispense:  20 each    Refill:  1    Order Specific Question:   Supervising Provider    Answer:   Lavonia Drafts [id number]   Feeling much better. Tolerating oral fluids.  Discuss history, exam, labs with Dr. Mancel Bale. Agrees with plan of care.  MDM - Nausea and vomiting of pregnancy controlled with Phenergan. Rx Phenergan suppositories to use when unable to keep down tablets.  ASSESSMENT 1. Nausea and vomiting of pregnancy, antepartum   2. Dichorionic diamniotic twin pregnancy, antepartum     PLAN Discharge home in stable conditionPer consult with Dr. Mancel Bale. Hyperemesis precautions Follow-up Information    [facility] Follow up on 04/13/2016.   Specialty:  Obstetrics and Gynecology Why:  As scheduled Contact information: [address] [place] [address] [phone]        [facility] Follow up.   Why:  As needed in emergencies Contact information: [address] [id number] [place] [phone]         Allergies as of 04/12/2016   No Known Allergies     Medication List    TAKE these medications   folic acid 1 MG tablet Commonly known as:  FOLVITE Take 2 mg by mouth daily.   prenatal multivitamin Tabs tablet Take 1  tablet by mouth daily at 12 noon.   promethazine 25 MG tablet Commonly known as:  PHENERGAN Take 25 mg by mouth every 6 (six) hours as needed for nausea or vomiting.   promethazine 25 MG suppository Commonly known as:  PHENERGAN Place 1 suppository (25 mg total) rectally every 6 (six) hours as needed for nausea or vomiting.        [place], [place] 04/12/2016  10:51 PM

## 2016-04-12 NOTE — Discharge Instructions (Signed)
° °Hyperemesis Gravidarum °Hyperemesis gravidarum is a severe form of nausea and vomiting that happens during pregnancy. Hyperemesis is worse than morning sickness. It may cause you to have nausea or vomiting all day for many days. It may keep you from eating and drinking enough food and liquids. Hyperemesis usually occurs during the first half (the first 20 weeks) of pregnancy. It often goes away once a woman is in her second half of pregnancy. However, sometimes hyperemesis continues through an entire pregnancy. °What are the causes? °The cause of this condition is not known. It may be related to changes in chemicals (hormones) in the body during pregnancy, such as the high level of pregnancy hormone (human chorionic gonadotropin) or the increase in the female sex hormone (estrogen). °What are the signs or symptoms? °Symptoms of this condition include: °· Severe nausea and vomiting. °· Nausea that does not go away. °· Vomiting that does not allow you to keep any food down. °· Weight loss. °· Body fluid loss (dehydration). °· Having no desire to eat, or not liking food that you have previously enjoyed. °How is this diagnosed? °This condition may be diagnosed based on: °· A physical exam. °· Your medical history. °· Your symptoms. °· Blood tests. °· Urine tests. °How is this treated? °This condition may be managed with medicine. If medicines to do not help relieve nausea and vomiting, you may need to receive fluids through an IV tube at the hospital. °Follow these instructions at home: °· Take over-the-counter and prescription medicines only as told by your health care provider. °· Avoid iron pills and multivitamins that contain iron for the first 3-4 months of pregnancy. If you take prescription iron pills, do not stop taking them unless your health care provider approves. °· Take the following actions to help prevent nausea and vomiting: °¨ In the morning, before getting out of bed, try eating a couple of dry  crackers or a piece of toast. °¨ Avoid foods and smells that upset your stomach. Fatty and spicy foods may make nausea worse. °¨ Eat 5-6 small meals a day. °¨ Do not drink fluids while eating meals. Drink between meals. °¨ Eat or suck on things that have ginger in them. Ginger can help relieve nausea. °¨ Avoid food preparation. The smell of food can spoil your appetite or trigger nausea. °· Follow instructions from your health care provider about eating or drinking restrictions. °· For snacks, eat high-protein foods, such as cheese. °· Keep all follow-up and pre-birth (prenatal) visits as told by your health care provider. This is important. °Contact a health care provider if: °· You have pain in your abdomen. °· You have a severe headache. °· You have vision problems. °· You are losing weight. °Get help right away if: °· You cannot drink fluids without vomiting. °· You vomit blood. °· You have constant nausea and vomiting. °· You are very weak. °· You are very thirsty. °· You feel dizzy. °· You faint. °· You have a fever or other symptoms that last for more than 2-3 days. °· You have a fever and your symptoms suddenly get worse. °Summary °· Hyperemesis gravidarum is a severe form of nausea and vomiting that happens during pregnancy. °· Making some changes to your eating habits may help relieve nausea and vomiting. °· This condition may be managed with medicine. °· If medicines to do not help relieve nausea and vomiting, you may need to receive fluids through an IV tube at the hospital. °This information   is not intended to replace advice given to you by your health care provider. Make sure you discuss any questions you have with your health care provider. °Document Released: 04/02/2005 Document Revised: 11/30/2015 Document Reviewed: 11/30/2015 °Elsevier Interactive Patient Education © 2017 Elsevier Inc. ° ° °Eating Plan for Hyperemesis Gravidarum °Hyperemesis gravidarum is a severe form of morning sickness. Because  this condition causes severe nausea and vomiting, it can lead to dehydration, malnutrition, and weight loss. One way to lessen the symptoms of nausea and vomiting is to follow the eating plan for hyperemesis gravidarum. It is often used along with prescribed medicines to control your symptoms. °What can I do to relieve my symptoms? °Listen to your body. Everyone is different and has different preferences. Find what works best for you. Take any of the following actions that are helpful to you: °· Eat and drink slowly. °· Eat 5-6 small meals daily instead of 3 large meals. °· Eat crackers before you get out of bed in the morning. °· Try having a snack in the middle of the night. °· Starchy foods are usually tolerated well. Examples include cereal, toast, bread, potatoes, pasta, rice, and pretzels. °· Ginger may help with nausea. Add ¼ tsp ground ginger to hot tea or choose ginger tea. °· Try drinking 100% fruit juice or an electrolyte drink. An electrolyte drink contains sodium, potassium, and chloride. °· Continue to take your prenatal vitamins as told by your health care provider. If you are having trouble taking your prenatal vitamins, talk with your health care provider about different options. °· Include at least 1 serving of protein with your meals and snacks. Protein options include meats or poultry, beans, nuts, eggs, and yogurt. Try eating a protein-rich snack before bed. Examples of these snacks include cheese and crackers or half of a peanut butter or turkey sandwich. °· Consider eliminating foods that trigger your symptoms. These may include spicy foods, coffee, high-fat foods, very sweet foods, and acidic foods. °· Try meals that have more protein combined with bland, salty, lower-fat, and dry foods, such as nuts, seeds, pretzels, crackers, and cereal. °· Talk with your healthcare provider about starting a supplement of vitamin B6. °· Have fluids that are cold, clear, and carbonated or sour. Examples  include lemonade, ginger ale, lemon-lime soda, ice water, and sparkling water. °· Try lemon or mint tea. °· Try brushing your teeth or using a mouth rinse after meals. °What should I avoid to reduce my symptoms? °Avoiding some of the following things may help reduce your symptoms. °· Foods with strong smells. Try eating meals in well-ventilated areas that are free of odors. °· Drinking water or other beverages with meals. Try not to drink anything during the 30 minutes before and after your meals. °· Drinking more than 1 cup of fluid at a time. Sometimes using a straw helps. °· Fried or high-fat foods, such as butter and cream sauces. °· Spicy foods. °· Skipping meals as best as you can. Nausea can be more intense on an empty stomach. If you cannot tolerate food at that time, do not force it. Try sucking on ice chips or other frozen items, and make up for missed calories later. °· Lying down within 2 hours after eating. °· Environmental triggers. These may include smoky rooms, closed spaces, rooms with strong smells, warm or humid places, overly loud and noisy rooms, and rooms with motion or flickering lights. °· Quick and sudden changes in your movement. °This information is not intended   to replace advice given to you by your health care provider. Make sure you discuss any questions you have with your health care provider. °Document Released: 01/28/2007 Document Revised: 11/30/2015 Document Reviewed: 11/01/2015 °Elsevier Interactive Patient Education © 2017 Elsevier Inc. ° ° °

## 2016-04-12 NOTE — MAU Note (Signed)
Urine in lab 

## 2016-04-12 NOTE — MAU Note (Signed)
Pt reports vomiting throughout preg so far and she was prescribed diclegis and phenergan. They have stopped working and she states she has vomited 8-9 times in 24 hrs.   Unable to keep foods or liquids down. Denies and fever, vag discharge or bleeding.

## 2016-04-13 ENCOUNTER — Other Ambulatory Visit (HOSPITAL_COMMUNITY)
Admission: RE | Admit: 2016-04-13 | Discharge: 2016-04-13 | Disposition: A | Discharge status: Home / Self Care | Payer: Managed Care, Other (non HMO) | Source: Ambulatory Visit | Attending: Obstetrics & Gynecology | Admitting: Obstetrics & Gynecology

## 2016-04-13 ENCOUNTER — Other Ambulatory Visit: Payer: Self-pay | Admitting: Obstetrics & Gynecology

## 2016-04-13 DIAGNOSIS — Z01419 Encounter for gynecological examination (general) (routine) without abnormal findings: Secondary | ICD-10-CM | POA: Diagnosis present

## 2016-04-13 DIAGNOSIS — Z113 Encounter for screening for infections with a predominantly sexual mode of transmission: Secondary | ICD-10-CM | POA: Insufficient documentation

## 2016-04-16 NOTE — L&D Delivery Note (Signed)
  [name], [name] [[id number]][[id number]]  Operative Delivery Note At 10:12 AM a viable female was delivered via Vaginal, Vacuum (Extractor).  Presentation: vertex; Position: Occiput,, Anterior; Station: +3.  Nose and mouth suctioned.  Cord clamped x 2 and cut.  Handed to awaiting NICU team.  Recommended vacuum due to fetal bradycardia.  Verbal consent: obtained from patient.   1 pull head crowned and released.  APGAR: 8, 10; weight 5 lb 6.1 oz (2441 g).   Placenta status: spontaneous intact .   Cord:  with the following complications: Nuchal cord x 1.  Cord pH: none    [name] 09/30/2016, 1:34 PM     [name], [name] [[id number]][[id number]]  Operative Delivery Note Baby was vertex by ultrasound after delivery of Twin A, Station was -2.  Pt pushed with intact membranes.  ROM spontaneously, fetal station at that time was +2.    At 10:33 AM a viable female was delivered via Vaginal, Vacuum Investment banker, operational(Extractor).  Presentation: vertex; Position: Occiput,, Posterior; Station: +2.  Vacuum recommended due to vaginal bleeding. Verbal consent: obtained from patient.   Kiwi applied to posterior occiput.  2 pop offs.  Baby delivered in 3 pushes.  No nuchal cord.  APGAR: 8, 9; weight 5 lb 5.2 oz (2415 g).   Placenta status: spontaneous  intact, .   Cord:  with the following complications:None .  Cord pH: n/a   Anesthesia:  Epidural Instruments: Kiwi x 2 Episiotomy: None Lacerations: 2nd degree. Right labial Suture Repair: 2.0 3.0 chromic Est. Blood Loss (mL):  500  Mom to postpartum.  Baby to Couplet care / Skin to Skin. No issues transitioning.  NICU signed off.  [name], [name] 09/30/2016, 1:34 PM

## 2016-04-18 LAB — CYTOLOGY - PAP
CHLAMYDIA, DNA PROBE: NEGATIVE
DIAGNOSIS: NEGATIVE
NEISSERIA GONORRHEA: NEGATIVE

## 2016-08-08 ENCOUNTER — Observation Stay (HOSPITAL_COMMUNITY)
Admission: AD | Admit: 2016-08-08 | Discharge: 2016-08-10 | Disposition: A | Discharge status: Home / Self Care | Payer: Managed Care, Other (non HMO) | Source: Ambulatory Visit | Attending: Obstetrics & Gynecology | Admitting: Obstetrics & Gynecology

## 2016-08-08 DIAGNOSIS — Z3A28 28 weeks gestation of pregnancy: Secondary | ICD-10-CM | POA: Insufficient documentation

## 2016-08-08 DIAGNOSIS — Z87891 Personal history of nicotine dependence: Secondary | ICD-10-CM | POA: Insufficient documentation

## 2016-08-08 DIAGNOSIS — O3433 Maternal care for cervical incompetence, third trimester: Principal | ICD-10-CM | POA: Insufficient documentation

## 2016-08-08 DIAGNOSIS — O30043 Twin pregnancy, dichorionic/diamniotic, third trimester: Secondary | ICD-10-CM | POA: Diagnosis not present

## 2016-08-08 DIAGNOSIS — O3430 Maternal care for cervical incompetence, unspecified trimester: Secondary | ICD-10-CM | POA: Diagnosis present

## 2016-08-08 DIAGNOSIS — Z3686 Encounter for antenatal screening for cervical length: Secondary | ICD-10-CM

## 2016-08-08 LAB — TYPE AND SCREEN
ABO/RH(D): O POS
Antibody Screen: NEGATIVE

## 2016-08-08 MED ORDER — BETAMETHASONE SOD PHOS & ACET 6 (3-3) MG/ML IJ SUSP
12.0000 mg | INTRAMUSCULAR | Status: AC
  Administered 2016-08-08 – 2016-08-09 (×2): 12 mg via INTRAMUSCULAR
  Filled 2016-08-08 (×2): qty 2

## 2016-08-08 MED ORDER — ACETAMINOPHEN 325 MG PO TABS
650.0000 mg | ORAL_TABLET | ORAL | Status: DC | PRN
  Administered 2016-08-09: 650 mg via ORAL
  Filled 2016-08-08: qty 2

## 2016-08-08 MED ORDER — ZOLPIDEM TARTRATE 5 MG PO TABS: 5.0000 mg | ORAL_TABLET | Freq: Every evening | ORAL | Status: DC | PRN

## 2016-08-08 MED ORDER — CALCIUM CARBONATE ANTACID 500 MG PO CHEW
2.0000 | CHEWABLE_TABLET | ORAL | Status: DC | PRN
  Administered 2016-08-09 (×2): 400 mg via ORAL
  Filled 2016-08-08 (×2): qty 2

## 2016-08-08 MED ORDER — PRENATAL MULTIVITAMIN CH
1.0000 | ORAL_TABLET | Freq: Every day | ORAL | Status: DC
  Administered 2016-08-09: 1 via ORAL
  Filled 2016-08-08: qty 1

## 2016-08-08 MED ORDER — LACTATED RINGERS IV SOLN
INTRAVENOUS | Status: DC
  Administered 2016-08-09 – 2016-08-10 (×2): via INTRAVENOUS

## 2016-08-08 MED ORDER — DOCUSATE SODIUM 100 MG PO CAPS
100.0000 mg | ORAL_CAPSULE | Freq: Every day | ORAL | Status: DC
  Administered 2016-08-09: 100 mg via ORAL
  Filled 2016-08-08: qty 1

## 2016-08-08 NOTE — H&P (Signed)
HPI: 26 y/o G2P0010 @ [redacted]w[redacted]d with di/di twin admitted for prolonged monitoring and rule out preterm labor.  She was seen in the office where she reported increased pelvic pressure and irregular contractions.  No LOF, +FM x 2.  ROS: no HA, no epigastric pain, no visual changes.    Pregnancy complicated by: 1) di/di twins- followed by [place] Last growth @ [redacted]w[redacted]d: Twin A- vertex/anterior/2#3oz (65%)                                       Twin B- transverse/post/2#3oz (65%) 2) UTI (staph)- treated, TOC negative   Prenatal Transfer Tool  Maternal Diabetes: No Genetic Screening: Normal Maternal Ultrasounds/Referrals: Normal Fetal Ultrasounds or other Referrals:  None Maternal Substance Abuse:  No Significant Maternal Medications:  None Significant Maternal Lab Results: None   PNL:  GBS unknown, Rub Immune, Hep B neg, RPR NR, HIV neg, GC/C neg, glucola:normal Hgb: 11.2 Blood type: O positive, antibody neg  OBHx: TAB x 1 PMHx:  none Meds:  PNV Allergy:  No Known Allergies SurgHx: none SocHx:   no Tobacco, no  EtOH, no Illicit Drugs  O: BP 121/78 (BP Location: Left Arm)   Pulse (!) 102   Temp 98 F (36.7 C) (Oral)   Resp 18   LMP 01/22/2016   SpO2 99%  Gen. AAOx3, NAD CV.  RRR  Resp. CTAB, no wheeze or crackles. Abd. Gravid,  no tenderness,  no rigidity,  no guarding Extr.  no edema B/L , no calf tenderness, no Homan's B/L FHT: in office Twin A: 145 by doppler, Twin B: 135 SSE:  Cervix appeared closed SVE:  1/50/-3 @ 1615  Labs: see orders  A/P:  26 y.o. G2P0010 @ [redacted]w[redacted]d EGA who presents for rule out preterm labor  1) Fetal well being- reassuring in office, plan for NST q shift -MFM [place] in am to assess growth and cervical length -Betamethasone for fetal lung maturity  2) Rule out preterm labor -toco monitoring -unable to collect FFN due to pelvic exam in office, consider FFN tomorrow after 4pm -IVF: LR @ 125cc/hr -will plan for repeat cervical exam in am or if clinically  indicated -further management pending MFM consultation  3) Maternal well being -bedrest with bathroom privileges -regular diet -continue PNV daily, tylenol and colace prn   Myna Hidalgo, DO [phone] (pager) [phone] (office)

## 2016-08-09 ENCOUNTER — Observation Stay (HOSPITAL_COMMUNITY): Payer: Managed Care, Other (non HMO)

## 2016-08-09 DIAGNOSIS — O3433 Maternal care for cervical incompetence, third trimester: Secondary | ICD-10-CM | POA: Diagnosis not present

## 2016-08-09 LAB — ABO/RH: ABO/RH(D): O POS

## 2016-08-09 NOTE — Progress Notes (Signed)
Patient back from MFM and placed on continuous TOCO per order. Pt denies having any pain at this time Carmelina Dane, RN

## 2016-08-09 NOTE — Progress Notes (Signed)
OB antepartum progress note  S: Patient resting comfortably in bed- denies feeling cramping or any contractions.  Denies vaginal or pelvic pressure.  No LOF, no VB, +FM.  No acute complaints.  O: BP 105/62 (BP Location: Right Arm)   Pulse (!) 109   Temp 98.5 F (36.9 C) (Oral)   Resp 16   LMP 01/22/2016   SpO2 99%   Gen: NAD CV: RRR Lungs: CTAB Abd: gravid, non-tender GU: Deferred Ext: no edema, no calf tenderness bilaterally  FHT: 145 x 2 Toco: irregular/occasional contractions  [place] today: Twin A- BREECH/AGA, normal AFI.  Twin B- vertex, AGA.  Growth discordance 5% On transvaginal ultrasound the cervix appears dynamic  varying between >3cm closed cervical length to complete  dilation of the entire cervical canal with a maximum diameter  of 1.5cm.  A/P: 25yo G2P0010 @ [redacted]w[redacted]d admitted for preterm labor 1) FWB: Reassuring x 2 - [place] today as above, AGA x 2 -continue fetal monitoring q shift -2nd betamethasone shot this evening  2) Preterm labor -continue toco monitoring, no regular contractions noted -  repeat SVE in am -unable to complete FFN due to transvaginal probe - plan for heplock and restart IV fluids if contractions become more regular -MFM recommendations given and in separate note  3) Maternal well being -bedrest with bathroom privileges -regular diet -continue PNV daily, tylenol and colace prn  DISPO: If in am cervix remains unchanged plan for bedrest x 1 wk with close outpatient observation.  Pt aware of labor precautions.  Myna Hidalgo, DO [phone] (pager) [phone] (office)

## 2016-08-09 NOTE — Progress Notes (Signed)
Maternal Fetal Medicine Consultation  Requesting Provider(s): Dr. Charlotta Newton  Reason for consultation: Di-di twins, preterm cervical dilation  HPI: Brooke Vance is a 26 yo G2P0010 at [redacted]w[redacted]d with di-di twins admitted for preterm cervical dilation. She was seen yesterday for a routine prenatal visit at which time she complained of intermittent contractions and pelvic pressure. On vaginal exam she was found to be dilated with an exam of 1/50/-3. She was referred to [facility] for further evaluation and was admitted for observation. She was started on a course of betamethasone for fetal lung maturity. She has not had any further vaginal exams since admission.   She reports feeling well. She continues to have pelvic pressure and intermittent contractions but these are unchanged over several weeks. She denies leakage of fluid or vaginal bleeding. Both babies are very active. This is a spontaneous twin pregnancy. She reports this pregnancy has otherwise been uncomplicated.    OB History: OB History    Gravida Para Term Preterm AB Living   2       1     SAB TAB Ectopic Multiple Live Births                  PMH:  Past Medical History:  Diagnosis Date  . Vaginal Pap smear, abnormal     PSH:  Past Surgical History:  Procedure Laterality Date  . DILATION AND CURETTAGE OF UTERUS     abortion   . THERAPEUTIC ABORTION     Meds: PNV,  folate Allergies:  NKDA FH: Twins on both sides of the family Soc:  Denies tobacco, alcohol, drug abuse  Review of Systems: no vaginal bleeding or cramping/contractions, no LOF, no nausea/vomiting. All other systems reviewed and are negative.  PE:  BP (!) 95/53 (BP Location: Left Arm)   Pulse (!) 107   Temp 97.6 F (36.4 C) (Oral)   Resp 16   LMP 01/22/2016   SpO2 100%  GEN: well-appearing female ABD: gravid, NT  Please see separate document for fetal ultrasound report.  A/P: Tabor is a 26 yo G2P0010 at [redacted]w[redacted]d with di-di twins admitted for preterm cervical  dilation. She feels well and denies any change in her symptoms since admission. Her cervical exam has not been reassessed, but on transvaginal ultrasound she is visibly dilated but with dynamic changes. We discussed the etiology and management of preterm labor particularly in the setting of twin pregnancy. I recommend continued inpatient observation through completion of steroid course tomorrow. If cervical exam is unchanged at that time and Addley feels otherwise well I believe outpatient management is appropriate. If evidence of progressive preterm labor recommend magnesium for fetal neuroprotection. We discussed that the presenting fetus is breech so delivery by Cesarean would be necessary if delivery is indicated.   Recommendations - Continued close clinical evaluation for progressive preterm labor, if appropriate for discharge recommend weekly visits - No indication for additional cervical length ultrasounds - If undelivered, recommend serial ultrasound for growth every 4 weeks and antenatal testing at 32 weeks unless indicated earlier - Delivery no later than 38 weeks if no earlier indication - Discussed activity restrictions at length, bedrest has been associated with poor pregnancy outcomes compared to reasonable typical activity levels and is not recommended  Thank you for the opportunity to be a part of the care of Brooke Vance. Please contact our office if we can be of further assistance.   I spent approximately 40 minutes with this patient with over 50% of time spent in  face-to-face counseling.  Darlyn Read, MD

## 2016-08-09 NOTE — Progress Notes (Signed)
Pt place on EFM. Carmelina Dane, RN

## 2016-08-10 DIAGNOSIS — O3433 Maternal care for cervical incompetence, third trimester: Secondary | ICD-10-CM | POA: Diagnosis not present

## 2016-08-10 NOTE — Discharge Instructions (Signed)
Preterm Labor and Birth Information Pregnancy normally lasts 39-41 weeks. Preterm labor is when labor starts early. It starts before you have been pregnant for 37 whole weeks. What are the risk factors for preterm labor? Preterm labor is more likely to occur in women who:  Have an infection while pregnant.  Have a cervix that is short.  Have gone into preterm labor before.  Have had surgery on their cervix.  Are younger than age 26.  Are older than age 38.  Are African American.  Are pregnant with two or more babies.  Take street drugs while pregnant.  Smoke while pregnant.  Do not gain enough weight while pregnant.  Got pregnant right after another pregnancy. What are the symptoms of preterm labor? Symptoms of preterm labor include:  Cramps. The cramps may feel like the cramps some women get during their period. The cramps may happen with watery poop (diarrhea).  Pain in the belly (abdomen).  Pain in the lower back.  Regular contractions or tightening. It may feel like your belly is getting tighter.  Pressure in the lower belly that seems to get stronger.  More fluid (discharge) leaking from the vagina. The fluid may be watery or bloody.  Water breaking. Why is it important to notice signs of preterm labor? Babies who are born early may not be fully developed. They have a higher chance for:  Long-term heart problems.  Long-term lung problems.  Trouble controlling body systems, like breathing.  Bleeding in the brain.  A condition called cerebral palsy.  Learning difficulties.  Death. These risks are highest for babies who are born before 34 weeks of pregnancy. How is preterm labor treated? Treatment depends on:  How long you were pregnant.  Your condition.  The health of your baby. Treatment may involve:  Having a stitch (suture) placed in your cervix. When you give birth, your cervix opens so the baby can come out. The stitch keeps the cervix  from opening too soon.  Staying at the hospital.  Taking or getting medicines, such as:  Hormone medicines.  Medicines to stop contractions.  Medicines to help the babys lungs develop.  Medicines to prevent your baby from having cerebral palsy. What should I do if I am in preterm labor? If you think you are going into labor too soon, call your doctor right away. How can I prevent preterm labor?  Do not use any tobacco products.  Examples of these are cigarettes, chewing tobacco, and e-cigarettes.  If you need help quitting, ask your doctor.  Do not use street drugs.  Do not use any medicines unless you ask your doctor if they are safe for you.  Talk with your doctor before taking any herbal supplements.  Make sure you gain enough weight.  Watch for infection. If you think you might have an infection, get it checked right away.  If you have gone into preterm labor before, tell your doctor. This information is not intended to replace advice given to you by your health care provider. Make sure you discuss any questions you have with your health care provider. Document Released: 06/29/2008 Document Revised: 09/13/2015 Document Reviewed: 08/24/2015 Elsevier Interactive Patient Education  2017 [employer]   Pelvic Rest Based on your overall health and the health of your baby, your health care provider will decide if pelvic rest is right for you. How do I rest my pelvis? For as long as told by your health care provider:  Do not have sex,  sexual stimulation, or an orgasm.  Do not use tampons. Do not douche. Do not put anything in your vagina.  Do not lift anything that is heavier than 10 lb (4.5 kg).  Avoid activities that take a lot of effort (are strenuous).  Avoid any activity in which your pelvic muscles could become strained. When should I seek medical care? Seek medical care if you have:  Cramping pain in your lower abdomen.  Vaginal discharge.  A low,  dull backache.  Regular contractions.  Uterine tightening. When should I seek immediate medical care? Seek immediate medical care if:  You have vaginal bleeding and you are pregnant. This information is not intended to replace advice given to you by your health care provider. Make sure you discuss any questions you have with your health care provider. Document Released: 07/28/2010 Document Revised: 09/08/2015 Document Reviewed: 10/04/2014 Elsevier Interactive Patient Education  2017 [employer].

## 2016-08-10 NOTE — Progress Notes (Signed)
OB antepartum progress note  S: Patient resting comfortably in bed- denies feeling cramping or any contractions.  Denies vaginal or pelvic pressure.  No LOF, no VB, +FM.  No acute complaints.  O: BP (!) 102/58   Pulse 100   Temp 98.3 F (36.8 C) (Oral)   Resp 16   LMP 01/22/2016   SpO2 100%   Gen: NAD  Lungs: normal respiratory effort Abd: gravid, non-tender GU: FT-1/50/-3 Ext: no edema, no calf tenderness bilaterally  FHT: 145 x 2 Toco: some irritability, rare contraction   [place] (08/09/16): Twin A- BREECH/AGA, normal AFI.  Twin B- vertex, AGA.  Growth discordance 5% On transvaginal ultrasound the cervix appears dynamic  varying between >3cm closed cervical length to complete  dilation of the entire cervical canal with a maximum diameter  of 1.5cm.  A/P: 25yo G2P0010 @ [redacted]w[redacted]d admitted for preterm labor 1) FWB: Reassuring x 2 -[place] as above -continue fetal monitoring q shift -s/p betamethasone course  2) Ruled out for preterm labor -no further cervical dilation noted -MFM recommendations given and in separate note  3) Maternal well being -bedrest with bathroom privileges -regular diet -continue PNV daily, tylenol and colace prn  DISPO: No further cervical dilation noted, plan for discharge home on bedrest with close outpatient follow up.  Myna Hidalgo, DO [phone] (pager) [phone] (office)

## 2016-08-10 NOTE — Progress Notes (Signed)
Pt discharged home with printed instruction. Pt verbalized an understanding. No concerns noted. Carmelina Dane, RN

## 2016-08-11 LAB — CULTURE, BETA STREP (GROUP B ONLY)

## 2016-08-11 LAB — OB RESULTS CONSOLE GBS: GBS: NEGATIVE

## 2016-08-14 NOTE — Discharge Summary (Signed)
Physician Discharge Summary  Patient ID: Brooke Vance MRN: [id number] DOB/AGE: [birth year] 25 y.o.  Admit date: 08/08/2016 Discharge date: 08/14/2016  Admission Diagnoses: Premature cervical dilation, di/di twins, rule out preterm labor  Discharge Diagnoses:  Active Problems:   Premature cervical dilation   Discharged Condition: stable  Hospital Course: 26 y/o G2P0010 @ [redacted]w[redacted]d with di/di twin admitted for prolonged monitoring and rule out preterm labor.  She was seen in the office where she reported increased pelvic pressure and irregular contractions and she was found to be 1cm dilated in office.  She was given IV hydration, Betamethasone for fetal lung maturity and was monitored closely for evidence of preterm labor.  She was noted to have irregular contractions that spaced out and on repeat exam, no further cervical dilation was noted.  Consults: None  Treatments: IV hydration and fetal monitoring  Discharge Exam: Blood pressure 135/87, pulse (!) 102, temperature 99.5 F (37.5 C), temperature source Oral, resp. rate 16, last menstrual period 01/22/2016, SpO2 98 %. Gen: NAD         Lungs: normal respiratory effort Abd: gravid, non-tender GU: FT-1/50/-3 Ext: no edema, no calf tenderness bilaterally  FHT: 145 x 2 Toco: some irritability, rare contraction   [place] (08/09/16): Twin A- BREECH/AGA, normal AFI.  Twin B- vertex, AGA.  Growth discordance 5% On transvaginal ultrasound the cervix appears dynamic varying between >3cm closed cervical length to complete dilation of the entire cervical canal with a maximum diameter of 1.5cm.  Disposition: 01-Home or Self Care   Allergies as of 08/10/2016   No Known Allergies     Medication List    STOP taking these medications   promethazine 25 MG suppository Commonly known as:  PHENERGAN     TAKE these medications   folic acid 1 MG tablet Commonly known as:  FOLVITE Take 2 mg by mouth 2 (two) times daily.   ondansetron  4 MG tablet Commonly known as:  ZOFRAN Take 1 tablet by mouth 2 (two) times daily as needed for nausea or vomiting.   prenatal multivitamin Tabs tablet Take 1 tablet by mouth daily at 12 noon.      Follow-up Information    Myna Hidalgo, M, DO Follow up in 1 week(s).   Specialty:  Obstetrics and Gynecology Contact information: [address] [place] [address] [phone]           Signed: Sharon Seller 08/14/2016, 6:45 AM

## 2016-09-24 ENCOUNTER — Encounter (HOSPITAL_COMMUNITY): Payer: Self-pay

## 2016-09-24 ENCOUNTER — Inpatient Hospital Stay (HOSPITAL_COMMUNITY)
Admission: AD | Admit: 2016-09-24 | Discharge: 2016-10-02 | DRG: 775 | Disposition: A | Discharge status: Home / Self Care | Payer: Medicaid Other | Source: Ambulatory Visit | Attending: Obstetrics & Gynecology | Admitting: Obstetrics & Gynecology

## 2016-09-24 DIAGNOSIS — O321XX1 Maternal care for breech presentation, fetus 1: Secondary | ICD-10-CM | POA: Diagnosis present

## 2016-09-24 DIAGNOSIS — R05 Cough: Secondary | ICD-10-CM | POA: Diagnosis not present

## 2016-09-24 DIAGNOSIS — O329XX0 Maternal care for malpresentation of fetus, unspecified, not applicable or unspecified: Secondary | ICD-10-CM

## 2016-09-24 DIAGNOSIS — O329XX2 Maternal care for malpresentation of fetus, unspecified, fetus 2: Secondary | ICD-10-CM

## 2016-09-24 DIAGNOSIS — Z3A35 35 weeks gestation of pregnancy: Secondary | ICD-10-CM | POA: Diagnosis not present

## 2016-09-24 DIAGNOSIS — O30043 Twin pregnancy, dichorionic/diamniotic, third trimester: Secondary | ICD-10-CM | POA: Diagnosis present

## 2016-09-24 DIAGNOSIS — Z87891 Personal history of nicotine dependence: Secondary | ICD-10-CM | POA: Diagnosis not present

## 2016-09-24 LAB — TYPE AND SCREEN
ABO/RH(D): O POS
Antibody Screen: NEGATIVE

## 2016-09-24 LAB — CBC
HCT: 30.7 % — ABNORMAL LOW (ref 36.0–46.0)
HEMOGLOBIN: 10.7 g/dL — AB (ref 12.0–15.0)
MCH: 33.6 pg (ref 26.0–34.0)
MCHC: 34.9 g/dL (ref 30.0–36.0)
MCV: 96.5 fL (ref 78.0–100.0)
PLATELETS: 164 10*3/uL (ref 150–400)
RBC: 3.18 MIL/uL — AB (ref 3.87–5.11)
RDW: 13.6 % (ref 11.5–15.5)
WBC: 9.8 10*3/uL (ref 4.0–10.5)

## 2016-09-24 MED ORDER — NIFEDIPINE 10 MG PO CAPS
10.0000 mg | ORAL_CAPSULE | Freq: Four times a day (QID) | ORAL | Status: DC | PRN
  Administered 2016-09-25: 10 mg via ORAL
  Filled 2016-09-24: qty 1

## 2016-09-24 MED ORDER — PRENATAL MULTIVITAMIN CH
1.0000 | ORAL_TABLET | Freq: Every day | ORAL | Status: DC
  Administered 2016-09-25 – 2016-09-28 (×4): 1 via ORAL
  Filled 2016-09-24 (×5): qty 1

## 2016-09-24 MED ORDER — ACETAMINOPHEN 325 MG PO TABS
650.0000 mg | ORAL_TABLET | ORAL | Status: DC | PRN
  Administered 2016-09-24 – 2016-09-26 (×2): 650 mg via ORAL
  Filled 2016-09-24 (×3): qty 2

## 2016-09-24 MED ORDER — NIFEDIPINE 10 MG PO CAPS
20.0000 mg | ORAL_CAPSULE | Freq: Once | ORAL | Status: AC
  Administered 2016-09-24: 20 mg via ORAL
  Filled 2016-09-24: qty 2

## 2016-09-24 MED ORDER — DOCUSATE SODIUM 100 MG PO CAPS
100.0000 mg | ORAL_CAPSULE | Freq: Every day | ORAL | Status: DC
  Administered 2016-09-25 – 2016-09-28 (×4): 100 mg via ORAL
  Filled 2016-09-24 (×4): qty 1

## 2016-09-24 MED ORDER — ZOLPIDEM TARTRATE 5 MG PO TABS: 5.0000 mg | ORAL_TABLET | Freq: Every evening | ORAL | Status: DC | PRN

## 2016-09-24 MED ORDER — LACTATED RINGERS IV SOLN
INTRAVENOUS | Status: DC
  Administered 2016-09-24 – 2016-09-26 (×8): via INTRAVENOUS

## 2016-09-24 MED ORDER — CALCIUM CARBONATE ANTACID 500 MG PO CHEW
2.0000 | CHEWABLE_TABLET | ORAL | Status: DC | PRN
  Administered 2016-09-25 – 2016-09-26 (×3): 400 mg via ORAL
  Filled 2016-09-24 (×3): qty 2

## 2016-09-24 NOTE — H&P (Addendum)
26 y/o G2P0010 @ 6583w1d with di/di twin admitted for prolonged monitoring and rule out preterm labor.  She was seen in the office this am where she states she has noted some increase pressure over the past week.  Usually feels about 4-5 contraction per day; however, currently she is reporting some irregular contractions, but not very painful.  States it is maybe a 4/10. No LOF, +FM x 2.  ROS: no HA, no epigastric pain, no visual changes.    Pregnancy complicated by: 1) di/di twins- followed by US Last growth @ 4139w3d- Twin A breech-4#15oz (44%),     Twin B-vertex,post/5#1oz (50%)   2) h/o preterm labor- pt initially admitted around 928wks  -cervix had been stable ~ 2-3/50/-3  -s/p BMZ course at 28wks   Prenatal Transfer Tool  Maternal Diabetes: No Genetic Screening: Normal Maternal Ultrasounds/Referrals: Normal Fetal Ultrasounds or other Referrals:  None Maternal Substance Abuse:  No Significant Maternal Medications:  None Significant Maternal Lab Results: None   PNL:  GBS negative, Rub Immune, Hep B neg, RPR NR, HIV neg, GC/C neg, glucola:normal Hgb: 11.2 Blood type: O positive, antibody neg  OBHx: TAB x 1 PMHx:  none Meds:  PNV Allergy:  No Known Allergies SurgHx: none SocHx:   no Tobacco, no  EtOH, no Illicit Drugs  O: BP 121/78 (BP Location: Left Arm)   Pulse (!) 102   Temp 98 F (36.7 C) (Oral)   Resp 18   LMP 01/22/2016   SpO2 99%  Gen. AAOx3, NAD CV.  RRR  Resp. CTAB, no wheeze or crackles. Abd. Gravid,  no tenderness,  no rigidity,  no guarding Extr.  no edema B/L , no calf tenderness, no Homan's B/L FHT: Twin A: 140, mod variability, +accels, no decels Twin: B 135, mod variability, +accels, no decels Toco: q 5-456min SVE:  4/70/-3- unchanged from prior exam, less bulging membranes  Labs: see orders  A/P:  26 y.o. G2P0010 @[redacted]w[redacted]d  EGA who presents for preterm labor  1) Fetal well being- reassuring in office, plan for NST q shift -s/p betamethasone  @ 28wks  2) Rule out preterm labor -toco monitoring -IVF: plan for fluid bolud and then LR @ 125cc/hr -no cervical change noted, plan for procardia per protocol -NPO until contractions improve  3) Maternal well being -bedrest with bathroom privileges -regular diet -continue PNV daily, tylenol and colace prn  DISPO: Plan for recheck of SVE if contractions continue or as clinically indicated  Myna HidalgoJennifer Nosson Wender, DO [phone] (pager) [phone] (office)

## 2016-09-25 NOTE — Progress Notes (Signed)
Brooke Vance is a 26 y.o. G2P0010 at 4938w2d Twin gestation, PTL  Subjective: Pt comfortable.  Feels contractions occasionally.  Has not had Procardia since 1600 yesterday. Has been able to rest except for babies feet that are moving low in her pelvis.  Objective: BP (!) 104/59 (BP Location: Left Arm)   Pulse (!) 102   Temp 98.3 F (36.8 C) (Oral)   Resp 18   Ht 5\' 8"  (1.727 m)   Wt 76.7 kg (169 lb)   LMP 01/22/2016   SpO2 100%   BMI 25.70 kg/m  No intake/output data recorded. No intake/output data recorded.  FHT:  140-150s, reactive, occasional variable UC:   Occasional contractions, not regular, irritability  SVE:   Dilation: 4 Effacement (%): 70 Station: -3 Exam by:: ozan md  Labs: Lab Results  Component Value Date   WBC 9.8 09/24/2016   HGB 10.7 (L) 09/24/2016   HCT 30.7 (L) 09/24/2016   MCV 96.5 09/24/2016   PLT 164 09/24/2016    Assessment / Plan: Twin gestation @ 35 2/7 weeks Contractions with advanced dliitation. Breech presentation.  Labor: Not in active labor.  Continue observation.  Last dose of Procardia almost 24 hours ago. Fetal Wellbeing:  Category I Pain Control:  Not requiring pain medication. I/D:  Sharla Kidneyn/a  Jaquesha Boroff 09/25/2016, 1:48 PM

## 2016-09-26 ENCOUNTER — Ambulatory Visit: Payer: Managed Care, Other (non HMO) | Admitting: Obstetrics and Gynecology

## 2016-09-26 MED ORDER — FAMOTIDINE 20 MG PO TABS
20.0000 mg | ORAL_TABLET | Freq: Every day | ORAL | Status: DC
  Administered 2016-09-26 – 2016-09-28 (×3): 20 mg via ORAL
  Filled 2016-09-26 (×3): qty 1

## 2016-09-26 MED ORDER — SODIUM CHLORIDE 0.9% FLUSH
3.0000 mL | Freq: Two times a day (BID) | INTRAVENOUS | Status: DC
  Administered 2016-09-27 – 2016-09-28 (×3): 3 mL via INTRAVENOUS

## 2016-09-26 MED ORDER — SOD CITRATE-CITRIC ACID 500-334 MG/5ML PO SOLN
ORAL | Status: AC
  Filled 2016-09-26: qty 15

## 2016-09-26 NOTE — Progress Notes (Addendum)
Brooke Vance is a 26 y.o. G2P0010 at 4165w3d Twin gestation, PTL  Subjective: Pt resting comfortably in bed, feeling some mild cramping this am.  Denies LOF, bleeding, +FM x 2.  Does report some nausea this am, no vomiting.  Tolerating gen diet without problems.  Voiding freely.  Objective: BP 111/71 (BP Location: Right Arm)   Pulse 97   Temp 98.3 F (36.8 C) (Oral)   Resp 17   Ht 5\' 8"  (1.727 m)   Wt 169 lb (76.7 kg)   LMP 01/22/2016   SpO2 99%   BMI 25.70 kg/m   FHT:  Twin A: 145, moderate variability, +accels, no decels; Twin B 140, moderate variability, +accels  UC:   Minimal contractions overnight, this am q 8min  SVE:   Dilation: 4 Effacement (%): 70 Station: -3 Exam by:: Brooke Vance  Labs: Lab Results  Component Value Date   WBC 9.8 09/24/2016   HGB 10.7 (L) 09/24/2016   HCT 30.7 (L) 09/24/2016   MCV 96.5 09/24/2016   PLT 164 09/24/2016    Assessment / Plan: 25yo G2P0010 @ 5465w3d for preterm labor -Breech presentation with advanced cervical dilation  1) Fetal well being- reactive for both twins -continue with q shift monitoring -s/p betamethasone @ 28wks  2) Preterm labor -pt still having intermittent contractions; however, no further cervical change noted -ZOX:[id number]:currently on IV fluids, plan to discontinue this am.  Encourage po hydration -s/p procardia since yesterday evening  3) Maternal well being -bedrest with bathroom privileges -regular diet -continue PNV daily, tylenol and colace prn  DISPO: Plan for discussion with MFM regarding management at this time.  ADDENDUM @ 6/13: Spoke with Dr. Ezzard StandingNewman, plan to consider in-house management with plan for delivery @ 37wks.    [name], Brooke Vance, M 09/26/2016, 7:53 AM

## 2016-09-27 LAB — TYPE AND SCREEN
ABO/RH(D): O POS
Antibody Screen: NEGATIVE

## 2016-09-27 NOTE — Progress Notes (Signed)
Brooke Vance is a 26 y.o. G2P0010 at 7110w4d Twin gestation, PTL  Subjective: Pt resting comfortably in bed, still reporting irregular cramping. Denies LOF, bleeding, +FM x 2.  Tolerating gen diet without problems.  Voiding freely.  No acute complaints.  Objective: BP 106/71 (BP Location: Right Arm)   Pulse 93   Temp 98.4 F (36.9 C) (Oral)   Resp 18   Ht 5\' 8"  (1.727 m)   Wt 169 lb (76.7 kg)   LMP 01/22/2016   SpO2 100%   BMI 25.70 kg/m   FHT:  Twin A: 130, moderate variability, +accels, no decels; Twin B 140, moderate variability, +accels, no decels  UC:   Irregular contractions with some irritability noted   SVE:   Deferred, last exam on 6/13- unchanged from previously 4/70/-3 Labs: Lab Results  Component Value Date   WBC 9.8 09/24/2016   HGB 10.7 (L) 09/24/2016   HCT 30.7 (L) 09/24/2016   MCV 96.5 09/24/2016   PLT 164 09/24/2016    Assessment / Plan: 25yo G2P0010 @ 5910w4d for preterm labor -Breech presentation with advanced cervical dilation  1) Fetal well being- reactive for both twins -continue with q shift monitoring -s/p betamethasone @ 28wks  2) Preterm labor -pt still having intermittent contractions; however, no further cervical change noted -continue toco monitoring. Repeat SVE as clinically indicated  3) Maternal well being -bedrest with bathroom privileges -regular diet -continue PNV daily, tylenol and colace prn  DISPO: Continue with in-house monitoring, plan for primary C-section at 37wks.  [name], Shalea Tomczak, M 09/27/2016, 6:40 AM

## 2016-09-28 ENCOUNTER — Inpatient Hospital Stay (HOSPITAL_COMMUNITY): Payer: Medicaid Other

## 2016-09-28 LAB — CBC
HEMATOCRIT: 31.4 % — AB (ref 36.0–46.0)
HEMOGLOBIN: 10.7 g/dL — AB (ref 12.0–15.0)
MCH: 33.4 pg (ref 26.0–34.0)
MCHC: 34.1 g/dL (ref 30.0–36.0)
MCV: 98.1 fL (ref 78.0–100.0)
Platelets: 150 10*3/uL (ref 150–400)
RBC: 3.2 MIL/uL — AB (ref 3.87–5.11)
RDW: 13.7 % (ref 11.5–15.5)
WBC: 9.7 10*3/uL (ref 4.0–10.5)

## 2016-09-28 LAB — TYPE AND SCREEN
ABO/RH(D): O POS
Antibody Screen: NEGATIVE

## 2016-09-28 MED ORDER — FENTANYL 2.5 MCG/ML BUPIVACAINE 1/10 % EPIDURAL INFUSION (WH - ANES)
14.0000 mL/h | INTRAMUSCULAR | Status: DC | PRN
  Administered 2016-09-29 – 2016-09-30 (×2): 14 mL/h via EPIDURAL
  Filled 2016-09-28 (×2): qty 100

## 2016-09-28 MED ORDER — LIDOCAINE HCL (PF) 1 % IJ SOLN
30.0000 mL | INTRAMUSCULAR | Status: DC | PRN
  Filled 2016-09-28: qty 30

## 2016-09-28 MED ORDER — EPHEDRINE 5 MG/ML INJ: 10.0000 mg | INTRAVENOUS | Status: DC | PRN

## 2016-09-28 MED ORDER — OXYTOCIN BOLUS FROM INFUSION: 500.0000 mL | Freq: Once | INTRAVENOUS | Status: DC

## 2016-09-28 MED ORDER — OXYTOCIN 40 UNITS IN LACTATED RINGERS INFUSION - SIMPLE MED
2.5000 [IU]/h | INTRAVENOUS | Status: DC
  Administered 2016-09-30: 2.5 [IU]/h via INTRAVENOUS
  Filled 2016-09-28: qty 1000

## 2016-09-28 MED ORDER — SOD CITRATE-CITRIC ACID 500-334 MG/5ML PO SOLN
30.0000 mL | ORAL | Status: DC | PRN
  Filled 2016-09-28: qty 15

## 2016-09-28 MED ORDER — LACTATED RINGERS IV SOLN
500.0000 mL | Freq: Once | INTRAVENOUS | Status: AC
  Administered 2016-09-29: 500 mL via INTRAVENOUS

## 2016-09-28 MED ORDER — OXYCODONE-ACETAMINOPHEN 5-325 MG PO TABS: 2.0000 | ORAL_TABLET | ORAL | Status: DC | PRN

## 2016-09-28 MED ORDER — LACTATED RINGERS IV BOLUS (SEPSIS)
1000.0000 mL | Freq: Once | INTRAVENOUS | Status: AC
  Administered 2016-09-28: 1000 mL via INTRAVENOUS

## 2016-09-28 MED ORDER — OXYCODONE-ACETAMINOPHEN 5-325 MG PO TABS: 1.0000 | ORAL_TABLET | ORAL | Status: DC | PRN

## 2016-09-28 MED ORDER — ACETAMINOPHEN 325 MG PO TABS
650.0000 mg | ORAL_TABLET | ORAL | Status: DC | PRN
  Administered 2016-09-30: 650 mg via ORAL

## 2016-09-28 MED ORDER — PHENYLEPHRINE 40 MCG/ML (10ML) SYRINGE FOR IV PUSH (FOR BLOOD PRESSURE SUPPORT): 80.0000 ug | PREFILLED_SYRINGE | INTRAVENOUS | Status: DC | PRN

## 2016-09-28 MED ORDER — LACTATED RINGERS IV SOLN
INTRAVENOUS | Status: DC
  Administered 2016-09-28 – 2016-09-29 (×3): via INTRAVENOUS

## 2016-09-28 MED ORDER — ONDANSETRON HCL 4 MG/2ML IJ SOLN
4.0000 mg | Freq: Four times a day (QID) | INTRAMUSCULAR | Status: DC | PRN
  Administered 2016-09-30: 4 mg via INTRAVENOUS
  Filled 2016-09-28: qty 2

## 2016-09-28 MED ORDER — LACTATED RINGERS IV SOLN
500.0000 mL | INTRAVENOUS | Status: DC | PRN
  Administered 2016-09-30: 500 mL via INTRAVENOUS

## 2016-09-28 MED ORDER — DIPHENHYDRAMINE HCL 50 MG/ML IJ SOLN: 12.5000 mg | INTRAMUSCULAR | Status: DC | PRN

## 2016-09-28 MED ORDER — PHENYLEPHRINE 40 MCG/ML (10ML) SYRINGE FOR IV PUSH (FOR BLOOD PRESSURE SUPPORT)
80.0000 ug | PREFILLED_SYRINGE | INTRAVENOUS | Status: DC | PRN
  Filled 2016-09-28: qty 10

## 2016-09-28 MED ORDER — ONDANSETRON HCL 4 MG PO TABS
4.0000 mg | ORAL_TABLET | Freq: Four times a day (QID) | ORAL | Status: DC | PRN
  Administered 2016-09-28: 4 mg via ORAL
  Filled 2016-09-28: qty 1

## 2016-09-28 NOTE — Progress Notes (Signed)
Brooke Vance is a 26 y.o. G2P0010 at 6725w5d Twin gestation, PTL  Subjective: Pt resting comfortably in bed, cramping has now subsided. Denies LOF, bleeding, +FM x 2.  Tolerating gen diet without problems.  Voiding freely.  No acute complaints.  Objective: BP (!) 91/49   Pulse (!) 109   Temp 98.3 F (36.8 C) (Oral)   Resp 18   Ht 5\' 8"  (1.727 m)   Wt 169 lb (76.7 kg)   LMP 01/22/2016   SpO2 98%   BMI 25.70 kg/m   FHT:  Twin A: 130, moderate variability, +accels, no decels; Twin B 140, moderate variability, +accels, no decels  UC:  Mild irritability overnight   SVE:   Deferred, last exam on 6/13- unchanged from previously 4/70/-3 Labs: Lab Results  Component Value Date   WBC 9.8 09/24/2016   HGB 10.7 (L) 09/24/2016   HCT 30.7 (L) 09/24/2016   MCV 96.5 09/24/2016   PLT 164 09/24/2016    Assessment / Plan: 25yo G2P0010 @ 7125w5d for preterm labor -Breech presentation with advanced cervical dilation  1) Fetal well being- reactive for both twins -continue with q shift monitoring -s/p betamethasone @ 28wks  2) Preterm labor -no further cervical change noted -continue toco monitoring. Repeat SVE as clinically indicated  3) Maternal well being -bedrest with bathroom privileges -regular diet -continue PNV daily, tylenol and colace prn  DISPO: Continue with in-house monitoring, plan for primary C-section at 37wks- scheduled for 6/25  Myna HidalgoOZAN, Anahy Esh, M 09/28/2016, 6:37 AM

## 2016-09-28 NOTE — Progress Notes (Signed)
Brooke Vance is a 26 y.o. G2P0010 at 3861w5d   Subjective: Pt comfortable.  Contractions have not gotten stronger.  Had some spotting earlier.  Denies LOF.  Objective: BP 108/64   Pulse (!) 103   Temp 98.6 F (37 C) (Oral)   Resp 18   Ht 5\' 8"  (1.727 m)   Wt 76.7 kg (169 lb)   LMP 01/22/2016   SpO2 99%   BMI 25.70 kg/m  No intake/output data recorded. No intake/output data recorded.  FHT:  Category I x 2 UC:   irregular, every 2-6 minutes SVE:  Loose 3 cm/80/-1, posterior, soft Labs: Lab Results  Component Value Date   WBC 9.7 09/28/2016   HGB 10.7 (L) 09/28/2016   HCT 31.4 (L) 09/28/2016   MCV 98.1 09/28/2016   PLT 150 09/28/2016    Assessment / Plan: Twin gestation @ 35 5/7 weeks  Vertex/Vertex. Preterm contractions without cervical change yet.  Labor: Await cervical change to establish diagnosis of labor. If no cervical change x 8 hours, transfer to Ob High risk. Preeclampsia:  Normal BP. Fetal Wellbeing:  Category I Pain Control:  Does not need pain medication at this time. I/D:  Brooke Vance  Brooke Vance 09/28/2016, 5:39 PM

## 2016-09-28 NOTE — Anesthesia Pain Management Evaluation Note (Signed)
  CRNA Pain Management Visit Note  Patient: Brooke Vance, 26 y.o., female  "Hello I am a member of the anesthesia team at [facility]. We have an anesthesia team available at all times to provide care throughout the hospital, including epidural management and anesthesia for C-section. I don't know your plan for the delivery whether it a natural birth, water birth, IV sedation, nitrous supplementation, doula or epidural, but we want to meet your pain goals."   1.Was your pain managed to your expectations on prior hospitalizations?   Yes   2.What is your expectation for pain management during this hospitalization?     Epidural  3.How can we help you reach that goal? **epidural  Record the patient's initial score and the patient's pain goal.   Pain: 2/10  Pain Goal: 0/10 [facility] wants you to be able to say your pain was always managed very well.  Brooke ArntSterling, Maxfield Gildersleeve Marie 09/28/2016

## 2016-09-28 NOTE — Progress Notes (Addendum)
Pt states contractions feel a little stronger. Ready to go to sleep. RN states pt has been comfortable. No pain medication requested.  Family at bedside with questions. Gen:  NAD Cervix: unchanged, Posterior, spotting Contractions unchanged. EFM reassuring. Ultrasound:  Vertex/vertex  Cont observation. Cervical exam if pain, LOF, pressure. NICU consult if available. Recommend rest through early labor.  Reviewed risks of vaginal delivery of twin gestation.  Discussed possibility of need for an emergent cesarean section for Twin B if abruption, fetal distress, malpresentation.  Pt agreeable to trial of labor.

## 2016-09-28 NOTE — Progress Notes (Addendum)
At bedside due to increased pelvic pressure and cramping.  O: BP 101/62 (BP Location: Right Arm)   Pulse 100   Temp 98.3 F (36.8 C) (Oral)   Resp 16   Ht 5\' 8"  (1.727 m)   Wt 169 lb (76.7 kg)   LMP 01/22/2016   SpO2 99%   BMI 25.70 kg/m   FHT: 130, moderate variability, +accels, no decels Twin B: 150, moderate variablity, + accels, no decels Toco: q 3-78min. SVE: 4/90/-1, vertex presentation  A/P: 25yo G2P0010 @ 4070w5d for preterm labor -FWB: Cat. I tracing x 2 -SVE: cervical change noted []  plan for bedside US to confirm positioning- if both vertex plan to transfer to L&D, if breech plan for primary C-section -NPO  ADDENDUM: Bedside MFM US, confirm vertex presentation.  Plan to transfer to L&D  Myna HidalgoJennifer Skyelar Halliday, DO [phone] (pager) [phone] (office)

## 2016-09-29 ENCOUNTER — Inpatient Hospital Stay (HOSPITAL_COMMUNITY): Payer: Medicaid Other

## 2016-09-29 ENCOUNTER — Inpatient Hospital Stay (HOSPITAL_COMMUNITY): Payer: Medicaid Other | Admitting: Anesthesiology

## 2016-09-29 LAB — RPR: RPR: NONREACTIVE

## 2016-09-29 MED ORDER — LIDOCAINE HCL (PF) 1 % IJ SOLN
INTRAMUSCULAR | Status: DC | PRN
  Administered 2016-09-29 (×2): 5 mL

## 2016-09-29 MED ORDER — TERBUTALINE SULFATE 1 MG/ML IJ SOLN: 0.2500 mg | Freq: Once | INTRAMUSCULAR | Status: DC | PRN

## 2016-09-29 MED ORDER — OXYTOCIN 40 UNITS IN LACTATED RINGERS INFUSION - SIMPLE MED
1.0000 m[IU]/min | INTRAVENOUS | Status: DC
  Administered 2016-09-29: 2 m[IU]/min via INTRAVENOUS

## 2016-09-29 NOTE — Progress Notes (Signed)
Bedside Ultrasound Vertex/Vertex. Contractions more regular.  Same intensity. Start Pitocin to augment labor. Spoke with Neonatologist Dr. Eric FormWimmer.  More availabilty in NICU.  Will come talk with pt today.

## 2016-09-29 NOTE — Consult Note (Signed)
Asked by Dr.Varnado to provide prenatal consultation for  26 y.o. G2 P0 who is now 35.[redacted] weeks EGA, with di/di female twins and preterm labor.  Pregnancy otherwise uncomplicated but she had a previous admission at 28 wks for possible early labor and was given a course of BMZ. Readmitted 6/11 for observation and now with onset of labor, being augmented with pitocin.  NO fever or other Sx of infection.  Spoke with patient and FOB about usual expectations for preterm infant at 4335- 36 wks weeks gestation, telling them most babies at this EGA do NOT need NICU.  Advised them that some such late preterm infants have difficulty with temp, feeding, glucose, or breathing and if so one or both might need to be transferred.  Also told them NICU team would attend delivery.  Patient was attentive, had appropriate questions, and was appreciative of my input.  Thank you for consulting Neonatology.  Total time 25 minutes  JWimmer, MD

## 2016-09-29 NOTE — Progress Notes (Signed)
Spoke with Dr. Clarisa FlingBrost.  No vasa previa, cord or previa. AROM clear, slightly pink. Unable to place IUPC or FSE.   Both babies were very active. Intermittent monitoring of Baby A but improved after epidural and is reassuring.   Resume Pitocin at 2 milliunits.

## 2016-09-29 NOTE — Progress Notes (Signed)
Dr Dion BodyVarnado called and discussed if pt needs confirmatory official US. [facility]Varnado [facility] to see if they can evaluate pt for cord placement (rule out vasaprevia). Dr Dion BodyVarnado on her way in to evaluate and see pt.

## 2016-09-29 NOTE — Progress Notes (Signed)
Brooke Vance is a 26 y.o. G2P0010 at 7376w6d   Subjective: Pt was able to rest overnight.  Woke up with some small contractions.  No bleeding, still spotting.  Denies LOF.  Active fetus x 2.  Objective: BP 125/76   Pulse (!) 106   Temp 98.8 F (37.1 C) (Oral)   Resp 18   Ht 5\' 8"  (1.727 m)   Wt 76.7 kg (169 lb)   LMP 01/22/2016   SpO2 99%   BMI 25.70 kg/m  No intake/output data recorded. No intake/output data recorded.  FHT:  Reactive, no declerations UC:   Mostly every 8 minutes. SVE:   Dilation: 4.5 Effacement (%): 70 Station: -1, -2 Exam by:: Dr Dion BodyVarnado  Labs: Lab Results  Component Value Date   WBC 9.7 09/28/2016   HGB 10.7 (L) 09/28/2016   HCT 31.4 (L) 09/28/2016   MCV 98.1 09/28/2016   PLT 150 09/28/2016    Assessment / Plan: Twin gestation @ 35 6/7 weeks   Labor: Will likely transition to active labor soon.  Allow pt to eat breakfast.  Expectant management for now.  Will update NICU. Preeclampsia:  BP normal. Fetal Wellbeing:  Category I x 2 Pain Control:  Epidural, IV pain meds, Nitrous Oxide and ordered I/D:  n/a Anticipated MOD:  NSVD  Grant Swager 09/29/2016, 9:47 AM

## 2016-09-29 NOTE — Anesthesia Preprocedure Evaluation (Signed)

## 2016-09-29 NOTE — Progress Notes (Signed)
LINEN changed. Up to shower.

## 2016-09-29 NOTE — Anesthesia Procedure Notes (Signed)
Epidural Patient location during procedure: OB  Staffing Anesthesiologist: Ossiel Marchio Performed: anesthesiologist   Preanesthetic Checklist Completed: patient identified, site marked, surgical consent, pre-op evaluation, timeout performed, IV checked, risks and benefits discussed and monitors and equipment checked  Epidural Patient position: sitting Prep: DuraPrep Patient monitoring: heart rate, continuous pulse ox and blood pressure Approach: right paramedian Location: L3-L4 Injection technique: LOR saline  Needle:  Needle type: Tuohy  Needle gauge: 17 G Needle length: 9 cm and 9 Needle insertion depth: 6 cm Catheter type: closed end flexible Catheter size: 20 Guage Catheter at skin depth: 10 cm Test dose: negative  Assessment Events: blood not aspirated, injection not painful, no injection resistance, negative IV test and no paresthesia  Additional Notes Patient identified. Risks/Benefits/Options discussed with patient including but not limited to bleeding, infection, nerve damage, paralysis, failed block, incomplete pain control, headache, blood pressure changes, nausea, vomiting, reactions to medication both or allergic, itching and postpartum back pain. Confirmed with bedside nurse the patient's most recent platelet count. Confirmed with patient that they are not currently taking any anticoagulation, have any bleeding history or any family history of bleeding disorders. Patient expressed understanding and wished to proceed. All questions were answered. Sterile technique was used throughout the entire procedure. Please see nursing notes for vital signs. Test dose was given through epidural needle and negative prior to continuing to dose epidural or start infusion. Warning signs of high block given to the patient including shortness of breath, tingling/numbness in hands, complete motor block, or any concerning symptoms with instructions to call for help. Patient was given  instructions on fall risk and not to get out of bed. All questions and concerns addressed with instructions to call with any issues.     

## 2016-09-29 NOTE — Progress Notes (Signed)
In to assess pt. Reassuring fetal status x2 Pitocin 16 mUs. Cervix 4-5/60/-2.  Vertex.  ?Cord over head palpated.  RN asked to recheck and she was unsure if was a cord.Bedside ultrasound w/ doppler could not rule out vasoprevia. Continue Pitocin.  Reassess once cervix more dilated. AROM if no vasoprevia. Anticipate SVD.

## 2016-09-29 NOTE — Progress Notes (Signed)
Spoke with Dr. Clarisa FlingBrost regarding exam findings.  Stated that vasa previa was not always clinically obvious by exam, rather a suspicion.  Speculum exam to visualize membranes and TVUS can be done intrapartum to visualize cord.  If vessels are low flow, TVUS may not pick up vessels.  Recommended doing both to be sure.  If vasa previa not suspected, then proceed with AROM and be ready for possible cesarean section.  If bleeding noted, would need to proceed with emergent c-section.  Discontinue Pitocin until evaluation complete.  Pt stating contractions are stronger. CVX:  Small area of concern, 4-5 cm/60/-1, taut bag.   Cervical membranes could not be seen vaginally with a Graves speculum.  Lifted up anterior lip but to thickness could not see much.  Cervical mucus white, not blood tinged.  Cat I x 2  Stat ultrasound ordered.  Pt and family updated.  Plan for AROM if TVUS neg.

## 2016-09-30 ENCOUNTER — Encounter (HOSPITAL_COMMUNITY): Payer: Self-pay | Admitting: Registered Nurse

## 2016-09-30 ENCOUNTER — Encounter (HOSPITAL_COMMUNITY)
Admission: AD | Disposition: A | Discharge status: Home / Self Care | Payer: Self-pay | Source: Ambulatory Visit | Attending: Obstetrics & Gynecology

## 2016-09-30 SURGERY — Surgical Case
Anesthesia: Epidural | Site: Vagina | Wound class: Clean Contaminated

## 2016-09-30 MED ORDER — PRENATAL MULTIVITAMIN CH
1.0000 | ORAL_TABLET | Freq: Every day | ORAL | Status: DC
  Administered 2016-09-30 – 2016-10-02 (×3): 1 via ORAL
  Filled 2016-09-30 (×2): qty 1

## 2016-09-30 MED ORDER — BENZOCAINE-MENTHOL 20-0.5 % EX AERO
1.0000 "application " | INHALATION_SPRAY | CUTANEOUS | Status: DC | PRN
  Administered 2016-10-01: 1 via TOPICAL
  Filled 2016-09-30 (×2): qty 56

## 2016-09-30 MED ORDER — ONDANSETRON HCL 4 MG PO TABS
4.0000 mg | ORAL_TABLET | ORAL | Status: DC | PRN
  Administered 2016-09-30: 4 mg via ORAL
  Filled 2016-09-30: qty 1

## 2016-09-30 MED ORDER — DOCUSATE SODIUM 100 MG PO CAPS
100.0000 mg | ORAL_CAPSULE | Freq: Two times a day (BID) | ORAL | Status: DC
  Administered 2016-09-30 – 2016-10-02 (×4): 100 mg via ORAL
  Filled 2016-09-30 (×5): qty 1

## 2016-09-30 MED ORDER — METHYLERGONOVINE MALEATE 0.2 MG PO TABS: 0.2000 mg | ORAL_TABLET | ORAL | Status: DC | PRN

## 2016-09-30 MED ORDER — ONDANSETRON HCL 4 MG/2ML IJ SOLN: 4.0000 mg | INTRAMUSCULAR | Status: DC | PRN

## 2016-09-30 MED ORDER — WITCH HAZEL-GLYCERIN EX PADS
1.0000 "application " | MEDICATED_PAD | CUTANEOUS | Status: DC | PRN
  Administered 2016-10-01: 1 via TOPICAL

## 2016-09-30 MED ORDER — OXYCODONE-ACETAMINOPHEN 5-325 MG PO TABS: 2.0000 | ORAL_TABLET | ORAL | Status: DC | PRN

## 2016-09-30 MED ORDER — MAGNESIUM HYDROXIDE 400 MG/5ML PO SUSP: 30.0000 mL | ORAL | Status: DC | PRN

## 2016-09-30 MED ORDER — SENNOSIDES-DOCUSATE SODIUM 8.6-50 MG PO TABS
2.0000 | ORAL_TABLET | ORAL | Status: DC
  Administered 2016-09-30 – 2016-10-02 (×2): 2 via ORAL
  Filled 2016-09-30 (×2): qty 2

## 2016-09-30 MED ORDER — METHYLERGONOVINE MALEATE 0.2 MG/ML IJ SOLN: 0.2000 mg | INTRAMUSCULAR | Status: DC | PRN

## 2016-09-30 MED ORDER — METHYLERGONOVINE MALEATE 0.2 MG/ML IJ SOLN
INTRAMUSCULAR | Status: AC
Start: 2016-09-30 — End: 2016-09-30
  Administered 2016-09-30: 0.2 mg
  Filled 2016-09-30: qty 1

## 2016-09-30 MED ORDER — OXYCODONE-ACETAMINOPHEN 5-325 MG PO TABS: 1.0000 | ORAL_TABLET | ORAL | Status: DC | PRN

## 2016-09-30 MED ORDER — SIMETHICONE 80 MG PO CHEW
80.0000 mg | CHEWABLE_TABLET | ORAL | Status: DC | PRN
Start: 2016-09-30 — End: 2016-10-02

## 2016-09-30 MED ORDER — DIBUCAINE 1 % RE OINT: 1.0000 "application " | TOPICAL_OINTMENT | RECTAL | Status: DC | PRN

## 2016-09-30 MED ORDER — DIPHENHYDRAMINE HCL 25 MG PO CAPS: 25.0000 mg | ORAL_CAPSULE | Freq: Four times a day (QID) | ORAL | Status: DC | PRN

## 2016-09-30 MED ORDER — COCONUT OIL OIL: 1.0000 "application " | TOPICAL_OIL | Status: DC | PRN

## 2016-09-30 MED ORDER — ZOLPIDEM TARTRATE 5 MG PO TABS: 5.0000 mg | ORAL_TABLET | Freq: Every evening | ORAL | Status: DC | PRN

## 2016-09-30 MED ORDER — IBUPROFEN 600 MG PO TABS
600.0000 mg | ORAL_TABLET | Freq: Four times a day (QID) | ORAL | Status: DC
  Administered 2016-09-30 – 2016-10-02 (×10): 600 mg via ORAL
  Filled 2016-09-30 (×10): qty 1

## 2016-09-30 MED ORDER — TETANUS-DIPHTH-ACELL PERTUSSIS 5-2.5-18.5 LF-MCG/0.5 IM SUSP: 0.5000 mL | Freq: Once | INTRAMUSCULAR | Status: DC

## 2016-09-30 MED ORDER — OXYTOCIN 40 UNITS IN LACTATED RINGERS INFUSION - SIMPLE MED: 2.5000 [IU]/h | INTRAVENOUS | Status: DC | PRN

## 2016-09-30 MED ORDER — ACETAMINOPHEN 325 MG PO TABS
650.0000 mg | ORAL_TABLET | ORAL | Status: DC | PRN
Start: 2016-09-30 — End: 2016-10-02
  Administered 2016-09-30 – 2016-10-01 (×2): 650 mg via ORAL
  Filled 2016-09-30 (×2): qty 2

## 2016-09-30 MED ORDER — FERROUS SULFATE 325 (65 FE) MG PO TABS
325.0000 mg | ORAL_TABLET | Freq: Two times a day (BID) | ORAL | Status: DC
  Administered 2016-09-30 – 2016-10-02 (×5): 325 mg via ORAL
  Filled 2016-09-30 (×6): qty 1

## 2016-09-30 SURGICAL SUPPLY — 26 items
BARRIER ADHS 3X4 INTERCEED (GAUZE/BANDAGES/DRESSINGS) ×4 IMPLANT
BRR ADH 4X3 ABS CNTRL BYND (GAUZE/BANDAGES/DRESSINGS) ×2
CHLORAPREP W/TINT 26ML (MISCELLANEOUS) ×4 IMPLANT
CLAMP CORD UMBIL (MISCELLANEOUS) ×12 IMPLANT
CLOTH BEACON ORANGE TIMEOUT ST (SAFETY) ×4 IMPLANT
DRSG OPSITE POSTOP 4X10 (GAUZE/BANDAGES/DRESSINGS) ×4 IMPLANT
ELECT REM PT RETURN 9FT ADLT (ELECTROSURGICAL) ×4
ELECTRODE REM PT RTRN 9FT ADLT (ELECTROSURGICAL) ×2 IMPLANT
EXTRACTOR VACUUM KIWI (MISCELLANEOUS) ×6 IMPLANT
GLOVE BIO SURGEON STRL SZ7 (GLOVE) ×4 IMPLANT
GLOVE BIOGEL PI IND STRL 7.0 (GLOVE) ×4 IMPLANT
GLOVE BIOGEL PI INDICATOR 7.0 (GLOVE) ×4
GOWN STRL REUS W/TWL LRG LVL3 (GOWN DISPOSABLE) ×8 IMPLANT
HOVERMATT SINGLE USE (MISCELLANEOUS) ×3 IMPLANT
NS IRRIG 1000ML POUR BTL (IV SOLUTION) ×4 IMPLANT
PACK C SECTION WH (CUSTOM PROCEDURE TRAY) ×4 IMPLANT
PAD OB MATERNITY 4.3X12.25 (PERSONAL CARE ITEMS) ×4 IMPLANT
PENCIL SMOKE EVAC W/HOLSTER (ELECTROSURGICAL) ×4 IMPLANT
RTRCTR C-SECT PINK 25CM LRG (MISCELLANEOUS) ×4 IMPLANT
SUT MON AB 4-0 PS1 27 (SUTURE) ×4 IMPLANT
SUT PLAIN 2 0 XLH (SUTURE) ×4 IMPLANT
SUT VIC AB 0 CTX 36 (SUTURE) ×20
SUT VIC AB 0 CTX36XBRD ANBCTRL (SUTURE) ×10 IMPLANT
SUT VIC AB 2-0 CT1 27 (SUTURE) ×4
SUT VIC AB 2-0 CT1 TAPERPNT 27 (SUTURE) ×2 IMPLANT
TOWEL OR 17X24 6PK STRL BLUE (TOWEL DISPOSABLE) ×4 IMPLANT

## 2016-09-30 NOTE — Progress Notes (Signed)
Brooke Vance is a 26 y.o. G2P0010 at 6915w0d   Subjective: Pt comfortable.  Objective: BP 111/73   Pulse 94   Temp 97.6 F (36.4 C) (Oral)   Resp 16   Ht 5\' 8"  (1.727 m)   Wt 76.7 kg (169 lb)   LMP 01/22/2016   SpO2 99%   BMI 25.70 kg/m  I/O last 3 completed shifts: In: -  Out: 625 [Urine:625] No intake/output data recorded.  FHT:  FHR: 130s bpm, variability: moderate,  accelerations:  Present,  decelerations:  Absent UC:   irregular, every 2-4 minutes SVE:   Dilation: 8 Effacement (%): 90, 100 Station: 0 Exam by:: Cyntia Staley MD Increased bloody show. Cervix on left almost gone, right is thicker but soft. Labs: Lab Results  Component Value Date   WBC 9.7 09/28/2016   HGB 10.7 (L) 09/28/2016   HCT 31.4 (L) 09/28/2016   MCV 98.1 09/28/2016   PLT 150 09/28/2016    Assessment / Plan: Twin gestation @ 36 weeks.  Labor: Progressed.  Pitocin at 4 mUs.  Turn pt, place peanut. Recommend attempting vaginal delivery. Preeclampsia:  BP normal. Fetal Wellbeing:  Category I Pain Control:  Epidural I/D:  no signs of infection Anticipated MOD:  NSVD.  Deliver in OR.  Donnavan Covault 09/30/2016, 7:07 AM

## 2016-09-30 NOTE — Transfer of Care (Signed)
Immediate Anesthesia Transfer of Care Note  Patient: Brooke Vance  Procedure(s) Performed: Procedure(s): CESAREAN SECTION MULTI-GESTATIONAL (N/A) VAGINAL DELIVERY- Double set up  Patient Location: OR 9  Anesthesia Type:Epidural  Level of Consciousness:  patient cooperative and responds to stimulation  Airway & Oxygen Therapy:Patient Spontanous Breathing   Post-op Assessment:  Report given to RN and Post -op Vital signs reviewed and stable  Post vital signs:  Reviewed and stable  Last Vitals:  Vitals:   09/30/16 0929 09/30/16 0930  BP: 117/74 117/74  Pulse: 83 83  Resp:  18  Temp:      Complications: No apparent anesthesia complications

## 2016-09-30 NOTE — Lactation Note (Signed)
This note was copied from a baby's chart. Lactation Consultation Note  Patient Name: Brooke Vance WUJWJ'XToday's Date: 09/30/2016 Reason for consult: Follow-up assessment;Late preterm infant;Multiple gestation;Infant < 6lbs   LPI twins 7613 hours old. Mom holding babies STS. Mom reports that she was not able to collect anymore EBM when she pumped earlier. Discussed progression of milk coming to volume and enc mom to continue to put babies to breast, then supplement with EBM/formula, and then post-pump. Mom reports that she really wants to breastfeed, but is too tired to put to breast right now. Discussed the benefits of STS and Enc mom to continue with the feeding plan and reviewed supplementation guidelines. Assisted mom to spoon-feed both babies, and then placed babies STS with mom. Brooke Batheresa, Brooke Vance at bedside.   Maternal Data    Feeding Feeding Type: Formula Length of feed: 5 min  LATCH Score/Interventions                      Lactation Tools Discussed/Used     Consult Status      Brooke Vance 09/30/2016, 11:51 PM

## 2016-09-30 NOTE — Anesthesia Postprocedure Evaluation (Signed)
Anesthesia Post Note  Patient: Brooke Vance  Procedure(s) Performed: Procedure(s) (LRB): CESAREAN SECTION MULTI-GESTATIONAL (N/A) VAGINAL DELIVERY- Double set up     Patient location during evaluation: Mother Baby Anesthesia Type: Epidural Level of consciousness: awake Pain management: pain level controlled Vital Signs Assessment: post-procedure vital signs reviewed and stable Respiratory status: spontaneous breathing Cardiovascular status: stable Postop Assessment: no headache, no backache, epidural receding, patient able to bend at knees, no signs of nausea or vomiting and adequate PO intake Anesthetic complications: no    Last Vitals:  Vitals:   09/30/16 1302 09/30/16 1424  BP: 122/76 106/62  Pulse: 62 81  Resp: 20 (!) 22  Temp: 36.8 C 36.6 C    Last Pain:  Vitals:   09/30/16 1424  TempSrc: Axillary  PainSc: 5    Pain Goal: Patients Stated Pain Goal: 3 (09/30/16 1424)               Grazia Taffe

## 2016-09-30 NOTE — Progress Notes (Signed)
Pt comfortable with epidural. Minimal change in cervix overnight on Pitocin. Cat I x 2. Recommend cesarean section.  Pt counseled, consent obtained.

## 2016-09-30 NOTE — Progress Notes (Signed)
Cardio cables placed by order of  fetal positioning. A maternal left (blue tracing on EFM) B=maternal right (yellow).

## 2016-09-30 NOTE — Lactation Note (Signed)
This note was copied from a baby's chart. Lactation Consultation Note  Patient Name: Clydell HakimBoyA Darcie Fodor ZOXWR'UToday's Date: 09/30/2016 Reason for consult: Initial assessment;Late preterm infant;Infant < 6lbs;Multiple gestation   LPI twins 7 hours old. Baby Boy "B" just finishing up nursing for 30 minutes and still cueing to feed. Mom reports that babies have been latching and nursing, but sleepy at breast. Assisted mom with hand expression and use of DEBP and mom able to collect 2 ml of EBM. Assisted parents to spoon-feed 1 ml of EBM to each baby. Both babies tolerated feeding well, and each actively extended their tongues to take the EBM from the spoon. Reviewed LPI guidelines with parents and enc keeping each feeding for each baby to 30 minutes.   Enc mom to put babies to breast first with cues and by 3 hours, then supplement with EBM/formula according to supplementation guidelines--which were given with review. Enc mom to post-pump for 15 minutes followed by hand expression. Discussed progression of milk coming to volume and supply and demand. Milagros Reaponna Esker, RN in the room and aware of the feeding plan.    Maternal Data Has patient been taught Hand Expression?: Yes Does the patient have breastfeeding experience prior to this delivery?: No  Feeding Feeding Type: Breast Milk Length of feed: 2 min  LATCH Score/Interventions                      Lactation Tools Discussed/Used Pump Review: Setup, frequency, and cleaning;Milk Storage Initiated by:: JW Date initiated:: 09/30/16   Consult Status Consult Status: Follow-up Date: 10/01/16 Follow-up type: In-patient    Sherlyn HayJennifer D Sion Reinders 09/30/2016, 6:11 PM

## 2016-09-30 NOTE — Progress Notes (Addendum)
No connection to OBIX in OR 9 . Using paper tracing.

## 2016-10-01 ENCOUNTER — Encounter (HOSPITAL_COMMUNITY): Payer: Self-pay | Admitting: Anesthesiology

## 2016-10-01 ENCOUNTER — Encounter (HOSPITAL_COMMUNITY): Payer: Self-pay | Admitting: *Deleted

## 2016-10-01 ENCOUNTER — Encounter (HOSPITAL_COMMUNITY)
Admission: AD | Disposition: A | Discharge status: Home / Self Care | Payer: Self-pay | Source: Ambulatory Visit | Attending: Obstetrics & Gynecology

## 2016-10-01 ENCOUNTER — Inpatient Hospital Stay (HOSPITAL_COMMUNITY)
Admission: RE | Admit: 2016-10-01 | Payer: Managed Care, Other (non HMO) | Source: Ambulatory Visit | Admitting: Obstetrics & Gynecology

## 2016-10-01 LAB — CBC
HCT: 28.2 % — ABNORMAL LOW (ref 36.0–46.0)
HEMOGLOBIN: 9.9 g/dL — AB (ref 12.0–15.0)
MCH: 33.9 pg (ref 26.0–34.0)
MCHC: 35.1 g/dL (ref 30.0–36.0)
MCV: 96.6 fL (ref 78.0–100.0)
Platelets: 165 10*3/uL (ref 150–400)
RBC: 2.92 MIL/uL — AB (ref 3.87–5.11)
RDW: 13.4 % (ref 11.5–15.5)
WBC: 11.5 10*3/uL — ABNORMAL HIGH (ref 4.0–10.5)

## 2016-10-01 SURGERY — Surgical Case
Anesthesia: Epidural | Site: Vagina | Wound class: Clean Contaminated

## 2016-10-01 MED ORDER — GUAIFENESIN-DM 100-10 MG/5ML PO SYRP
5.0000 mL | ORAL_SOLUTION | ORAL | Status: DC | PRN
  Administered 2016-10-01 – 2016-10-02 (×2): 5 mL via ORAL
  Filled 2016-10-01 (×3): qty 5

## 2016-10-01 NOTE — Progress Notes (Signed)
Post Partum Day 1 vacuum extraction twins  Subjective: up ad lib, voiding and tolerating PO patient complaining of cough. Lochia has decreased.  Objective: Blood pressure 108/61, pulse 93, temperature 98.4 F (36.9 C), temperature source Axillary, resp. rate 18, height 5\' 8"  (1.727 m), weight 76.7 kg (169 lb), last menstrual period 01/22/2016, SpO2 99 %, unknown if currently breastfeeding.  Physical Exam:  General: alert and cooperative Lochia: appropriate Uterine Fundus: firm Incision: NA DVT Evaluation: No evidence of DVT seen on physical exam.   Recent Labs  09/28/16 1402 10/01/16 0531  HGB 10.7* 9.9*  HCT 31.4* 28.2*    Assessment/Plan: Plan for discharge tomorrow, Breastfeeding, Lactation consult and Circumcision prior to discharge  Pt desires inpatient circumcision Robitussin DM for cough Routine postpartum care    LOS: 7 days   Brooke Vance. 10/01/2016, 1:52 PM

## 2016-10-01 NOTE — Progress Notes (Signed)
CSW received consult for hx of marijuana use.  Referral was screened out due to the following: °~MOB had no documented substance use after initial prenatal visit/+UPT. °~MOB had no positive drug screens after initial prenatal visit/+UPT. °~Baby's UDS is negative. ° °Please consult CSW if current concerns arise or by MOB's request. ° °CSW will monitor CDS results and make report to Child Protective Services if warranted. ° °Ison Wichmann Boyd-Gilyard, MSW, LCSW °Clinical Social Work °[phone] ° °

## 2016-10-01 NOTE — Lactation Note (Signed)
This note was copied from [name]'s chart. Lactation Consultation Note  Patient Name: Brooke Vance GMWNU'UToday's Date: 10/01/2016 Reason for consult: Follow-up assessment;Late preterm infant;Infant < 6lbs;Multiple gestation  Follow up visit at 8736 hours of age. Mom reports formula feeding during the day and has only pumped once.  Mom reports she will work on pumping during the night, its hard for mom to take time to pump.  LC encouraged mom to work on hand expression as often as she thinks of it to establish Vance milk supply and encouraged mom to post pump when Brooke is supplemented with formula.  Mom has 2 support people during the night, LC encouraged mom to have them formula feed while she is pumping.    Brooke Vance has had 8 voids and no stool in lifetime. Brooke Vance recently had first stool with 4 voids.   Mom has concerns about Brooke sneezing and sounds stuffy.  [facility], Kristine LineaKaren Kane.      Maternal Data    Feeding    LATCH Score/Interventions                      Lactation Tools Discussed/Used     Consult Status Consult Status: Follow-up Date: 10/02/16 Follow-up type: In-patient    Brooke Vance, Brooke Vance 10/01/2016, 10:36 PM

## 2016-10-02 ENCOUNTER — Ambulatory Visit: Payer: Self-pay

## 2016-10-02 MED ORDER — ACETAMINOPHEN FOR CIRCUMCISION 160 MG/5 ML: 40.0000 mg | ORAL | Status: DC | PRN

## 2016-10-02 MED ORDER — SUCROSE 24% NICU/PEDS ORAL SOLUTION: 0.5000 mL | OROMUCOSAL | Status: DC | PRN

## 2016-10-02 MED ORDER — LIDOCAINE 1% INJECTION FOR CIRCUMCISION
0.8000 mL | INJECTION | Freq: Once | INTRAVENOUS | Status: DC
  Filled 2016-10-02: qty 1

## 2016-10-02 MED ORDER — IBUPROFEN 600 MG PO TABS: 600.0000 mg | ORAL_TABLET | Freq: Four times a day (QID) | ORAL | 0 refills | Status: DC

## 2016-10-02 MED ORDER — EPINEPHRINE TOPICAL FOR CIRCUMCISION 0.1 MG/ML: 1.0000 [drp] | TOPICAL | Status: DC | PRN

## 2016-10-02 MED ORDER — FERROUS GLUCONATE 324 (38 FE) MG PO TABS: 324.0000 mg | ORAL_TABLET | Freq: Two times a day (BID) | ORAL | 3 refills | Status: DC

## 2016-10-02 MED ORDER — ACETAMINOPHEN FOR CIRCUMCISION 160 MG/5 ML: 40.0000 mg | Freq: Once | ORAL | Status: DC

## 2016-10-02 NOTE — Plan of Care (Signed)
Problem: Coping: Goal: Ability to cope will improve Outcome: Completed/Met Date Met: 10/02/16 Patient feels much better now that she has two people helping her, she states.

## 2016-10-02 NOTE — Discharge Summary (Signed)
OB Discharge Summary     Patient Name: Brooke Vance DOB: 06/30/1990 MRN: [id number]  Date of admission: 09/24/2016 Delivering MD:    Brooke Vance, BoyA [name] [[id number]][[id number]]  Brooke Vance, Brooke   Vance, Brooke Vance [[id number]][[id number]]      Brooke Vance, BoyB Brooke Vance [[id number]][[id number]]  Brooke Vance, Brooke   Date of discharge: 10/02/2016  Admitting diagnosis: Di/Di twin pregnancy, preterm labor Intrauterine pregnancy: 7431w0d     Secondary diagnosis:  Principal Problem:   Vaginal delivery--twins Active Problems:   Dichorionic diamniotic twin pregnancy in third trimester  Additional problems: same     Discharge diagnosis: Preterm Pregnancy Delivered                                                                                                Post partum procedures:none  Augmentation: AROM and Pitocin  Complications: None  Hospital course:  Onset of Labor With Vaginal Delivery     26 y.o. yo [id number] at 845w1d who was admitted for preterm labor.  Pt was given IV hydration and procardia and contractions eventually spaced out.  She was kept in hospital due to breech presentation and advanced dilation.  At 8810w5d, pt was noted to have return of contractions and was noted to have change in effacement as well as potential vertex presentation.  Official US confirmed vertex presentation.  Due to cervical change she was brought to L&D.  Though slow, she continued to make cervical change.  When contractions spaced out she was augmented with Pitocin and AROM.  Due to fetal concern, vacuum delivery was required of both twins.  For information regarding the delivery, please see the delivery note- Dr. Dion BodyVarnado.  Patient had a delivery of a Viable infant.   Brooke Vance, BoyA Brooke Vance [[id number]][[id number]]  09/30/2016   Brooke Vance, Brooke Vance [[id number]][[id number]]    Brooke Vance, BoyB Brooke Vance [[id number]][[id number]]  09/30/2016 Information for the patient's newborn:  Brooke Vance, BoyA Brooke Vance [[id number]][[id number]]  Delivery Method: Vaginal,  Vacuum (Extractor) (Filed from Delivery Summary) Information for the patient's newborn:  Brooke Vance, Brooke Vance [[id number]][[id number]]    Information for the patient's newborn:  Brooke Vance, BoyB Brooke Vance [[id number]][[id number]]  Delivery Method: Vaginal, Vacuum (Extractor) (Filed from Delivery Summary)   Brooke Vance had an uncomplicated postpartum course.  She is ambulating, tolerating a regular diet, passing flatus, and urinating well. Patient is discharged home in stable condition on 10/02/16.   Physical exam  Vitals:   09/30/16 1424 09/30/16 1805 10/01/16 1833 10/02/16 0603  BP: 106/62 108/61 115/69 112/70  Pulse: 81 93 89 83  Resp: (!) 22 18 17 16   Temp: 97.9 F (36.6 C) 98.4 F (36.9 C) 97.7 F (36.5 C) 98.8 F (37.1 C)  TempSrc: Axillary Axillary Oral Oral  SpO2: 98% 99% 100%   Weight:      Height:       General: alert, cooperative and no distress Lochia: appropriate Uterine Fundus: firm Incision: N/A DVT Evaluation: No evidence of DVT seen on physical exam. Labs: Lab Results  Component Value Date   WBC 11.5 (H) 10/01/2016   HGB 9.9 (L) 10/01/2016   HCT 28.2 (L) 10/01/2016   MCV 96.6 10/01/2016   PLT 165  10/01/2016   No flowsheet data found.  Discharge instruction: per After Visit Summary and "Baby and Me Booklet".  After visit meds:  Allergies as of 10/02/2016   No Known Allergies     Medication List    STOP taking these medications   calcium carbonate 500 MG chewable tablet Commonly known as:  TUMS - dosed in mg elemental calcium   folic acid 1 MG tablet Commonly known as:  FOLVITE   ondansetron 4 MG tablet Commonly known as:  ZOFRAN     TAKE these medications   ferrous gluconate 324 MG tablet Commonly known as:  FERGON Take 1 tablet (324 mg total) by mouth 2 (two) times daily with a meal.   ibuprofen 600 MG tablet Commonly known as:  ADVIL,MOTRIN Take 1 tablet (600 mg total) by mouth every 6 (six) hours.   prenatal multivitamin Tabs tablet Take 1 tablet by mouth  daily at 12 noon.       Diet: routine diet  Activity: Advance as tolerated. Pelvic rest for 6 weeks.   Outpatient follow up:6 weeks Follow up Appt:No future appointments. Follow up Visit:No Follow-up on file.  Postpartum contraception: Progesterone only pills  Newborn Data:   Brooke, Vance [[id number]]  Live born female  Birth Weight: 5 lb 6.1 oz (2441 g) APGAR: 8, 10   Brooke, Vance [[id number]]  Live born unspecified sex  Birth Weight:   APGARMarland Kitchen    Brooke, Vance [[id number]]  Live born female  Birth Weight: 5 lb 5.2 oz (2415 g) APGAR: 8, 9  Baby Feeding: Bottle Disposition: baby patient   10/02/2016 Brooke Vance, M, DO

## 2016-10-02 NOTE — Plan of Care (Signed)
Problem: Nutritional: Goal: Mothers verbalization of comfort with breastfeeding process will improve Outcome: Progressing [facility] through Lactation.  Patient still is not sure of her plan, but is currently pumping and bottle feeding breast milk and formula.  Will continue to provide support for patient.

## 2016-10-02 NOTE — Procedures (Deleted)
Circumcision Procedure note: ID Band was checked.  Procedure/Patient and site was verified immediately prior to start of the circumcision.   Physician: Dr. Happy Begeman  Procedure:  Anesthesia: dorsal penile block with lidocaine 1% without epinephrine. Clamp: Mogen The site was prepped in the usual sterile fashion with betadine.  Sucrose was given as needed.  Bleeding, redness and swelling was minimal.  Gelfoam dressing was applied.  The patient tolerated the procedure without complications.  Rakwon Letourneau, DO [phone] (pager) [phone] (office)    

## 2016-10-02 NOTE — Progress Notes (Signed)
Post Partum Day 2 vacuum extraction twins  Subjective: Pt resting comfortably reports no F/C/CP/SOB.  Tolerating gen diet.  +flatus, no BM.  Lochia appropriate.  Ambulating and voiding without problems.  No acute complains.  Objective: Blood pressure 112/70, pulse 83, temperature 98.8 F (37.1 C), temperature source Oral, resp. rate 16, height 5\' 8"  (1.727 m), weight 169 lb (76.7 kg), last menstrual period 01/22/2016, SpO2 100 %, unknown if currently breastfeeding.  Physical Exam:  General: alert and cooperative Lochia: appropriate Uterine Fundus: firm Incision: NA DVT Evaluation: No evidence of DVT seen on physical exam.  No edema bilaterally  Recent Labs  10/01/16 0531  HGB 9.9*  HCT 28.2*    Assessment/Plan: 25yo [id number] s/p VAVD x 2, PPD#2 -Pain well controlled -Lochia appropriate Robitussin DM for cough Routine postpartum care  -inpatient circ x2 completed  Meeting postpartum milestones appropriately, plan for discharge home today.   LOS: 8 days   Myna HidalgoOZAN, Maela Takeda, M 10/02/2016, 6:49 AM

## 2016-10-02 NOTE — Plan of Care (Signed)
Problem: Nutritional: Goal: Dietary intake will improve Outcome: Completed/Met Date Met: 10/02/16 Patient has a good appetite and is eating appropriately.

## 2016-10-02 NOTE — Discharge Instructions (Signed)

## 2016-10-02 NOTE — Lactation Note (Signed)
This note was copied from a baby's chart. Lactation Consultation Note  Patient Name: Brooke Vance Reason for consult: Follow-up assessment;Multiple gestation;Infant < 6lbs  Follow up visit at 59 hours of age.  Babies are mostly getting formula bottle feedings with few mls of EBM with several recent feedings.  Mom reports pumping a few times today.  Mom denies breast discomfort when pumping, but reports some cramping and nausea sometimes even before she begins to pump.  Lc encouraged mom as she has hormones reacting when pumping to increase her supply.   Mom plans to continue feeding plan at home.  Mom has WIC and will want to use a DEBP and will need formula. LC encouraged mom to call [facility]IC in am [facility] and to schedule [facility].  Mom reports she may latch more when she has more milk and babies are older.   LC encouraged mom to slowly increase volume of feedings as babies are tolerating.  Mom denies concerns at this time.    Maternal Data    Feeding Feeding Type: Bottle Fed - Breast Milk  LATCH Score/Interventions                      Lactation Tools Discussed/Used     Consult Status Consult Status: Follow-up Date: 10/03/16 Follow-up type: In-patient    Kaydan Wong, Arvella MerlesJana Lynn Vance, 9:25 PM

## 2016-10-02 NOTE — Plan of Care (Signed)
Problem: Education: Goal: Knowledge of condition will improve Outcome: Completed/Met Date Met: 10/02/16 Benefits of skin to skin explained; bleeding discussed.

## 2016-10-03 ENCOUNTER — Ambulatory Visit: Payer: Self-pay

## 2016-10-03 NOTE — Lactation Note (Signed)
This note was copied from a baby's chart. Lactation Consultation Note  Patient Name: Hyacinth MeekerBoyB Yerania Gribble ZOXWR'UToday's Date: 10/03/2016   Visited with Mom of 954w3d twin babies at 370 hrs old.  Both babies are being bottle fed 22 cal formula, and doing much better taking increased volumes (per Mom).  Babies had just fed 50 ml when LC entered room.  Talked to Mom about importance of regular and frequent double pumping to establish an adequate milk supply.  Mom states she last pumped yesterday evening, and breasts are filling. Encouraged STS, and lots of breast massage and hand expression along with double pumping. Mom has WIC, encouraged her to call for a pump.  [facility] given[facility]   Offered OP lactation appointment for follow up on breastfeeding, but Mom is wanting to exclusively pump and bottle feed.  Judee ClaraSmith, Kasmira Cacioppo E 10/03/2016, 9:10 AM

## 2016-10-15 ENCOUNTER — Encounter (HOSPITAL_COMMUNITY): Payer: Self-pay | Admitting: Obstetrics & Gynecology

## 2018-05-29 ENCOUNTER — Emergency Department (HOSPITAL_COMMUNITY)
Admission: EM | Admit: 2018-05-29 | Discharge: 2018-05-29 | Disposition: A | Discharge status: Home / Self Care | Payer: Self-pay | Attending: Emergency Medicine | Admitting: Emergency Medicine

## 2018-05-29 ENCOUNTER — Other Ambulatory Visit: Payer: Self-pay

## 2018-05-29 ENCOUNTER — Encounter (HOSPITAL_COMMUNITY): Payer: Self-pay

## 2018-05-29 ENCOUNTER — Emergency Department (HOSPITAL_COMMUNITY): Payer: Self-pay

## 2018-05-29 DIAGNOSIS — Y93E8 Activity, other personal hygiene: Secondary | ICD-10-CM | POA: Insufficient documentation

## 2018-05-29 DIAGNOSIS — X500XXA Overexertion from strenuous movement or load, initial encounter: Secondary | ICD-10-CM | POA: Insufficient documentation

## 2018-05-29 DIAGNOSIS — Y929 Unspecified place or not applicable: Secondary | ICD-10-CM | POA: Insufficient documentation

## 2018-05-29 DIAGNOSIS — Y999 Unspecified external cause status: Secondary | ICD-10-CM | POA: Insufficient documentation

## 2018-05-29 DIAGNOSIS — Z87891 Personal history of nicotine dependence: Secondary | ICD-10-CM | POA: Insufficient documentation

## 2018-05-29 DIAGNOSIS — S161XXA Strain of muscle, fascia and tendon at neck level, initial encounter: Secondary | ICD-10-CM | POA: Insufficient documentation

## 2018-05-29 DIAGNOSIS — Z79899 Other long term (current) drug therapy: Secondary | ICD-10-CM | POA: Insufficient documentation

## 2018-05-29 MED ORDER — METHOCARBAMOL 500 MG PO TABS: 500.0000 mg | ORAL_TABLET | Freq: Two times a day (BID) | ORAL | 0 refills | Status: DC

## 2018-05-29 MED ORDER — OXYCODONE-ACETAMINOPHEN 5-325 MG PO TABS
1.0000 | ORAL_TABLET | Freq: Once | ORAL | Status: AC
  Administered 2018-05-29: 1 via ORAL
  Filled 2018-05-29: qty 1

## 2018-05-29 NOTE — Discharge Instructions (Addendum)
Please read attached information. If you experience any new or worsening signs or symptoms please return to the emergency room for evaluation. Please follow-up with your primary care provider or specialist as discussed. Please use medication prescribed only as directed and discontinue taking if you have any concerning signs or symptoms.   °

## 2018-05-29 NOTE — ED Notes (Signed)
Patient transported to CT 

## 2018-05-29 NOTE — ED Provider Notes (Signed)
[facility] EMERGENCY DEPARTMENT Provider Note   CSN: [id number] Arrival date & time: 05/29/18  7829     History   Chief Complaint Chief Complaint  Patient presents with  . Neck Pain    HPI Brooke Vance is a 28 y.o. female.  HPI   28 year old female presents today with complaints of neck pain.  Yesterday morning she notes she was brushing her hair.  She was running the comb to the left side of her hair and resisting with her head.  She notes hearing a pop and having pain along the lateral aspect of her neck.  She notes she has difficulty with range of motion in all directions worse with turning to the left, she notes pain with palpation at the posterior aspect of the neck.  She denies any headache, dizziness or any neurological deficits.  No history of the same.      Past Medical History:  Diagnosis Date  . Vaginal Pap smear, abnormal     Patient Active Problem List   Diagnosis Date Noted  . Vaginal delivery--twins 09/30/2016  . Dichorionic diamniotic twin pregnancy in third trimester 09/24/2016  . Premature cervical dilation 08/08/2016    Past Surgical History:  Procedure Laterality Date  . DILATION AND CURETTAGE OF UTERUS     abortion   . THERAPEUTIC ABORTION    . VAGINAL DELIVERY N/A 09/30/2016   Procedure: VAGINAL DELIVERY;  Surgeon: Thurnell Lose, MD;  Location: [facility];  Service: Obstetrics;  Laterality: N/A;     OB History    Gravida  2   Para  1   Term      Preterm  1   AB  1   Living  2     SAB      TAB      Ectopic      Multiple  1   Live Births  2            Home Medications    Prior to Admission medications   Medication Sig Start Date End Date Taking? Authorizing Provider  ferrous gluconate (FERGON) 324 MG tablet Take 1 tablet (324 mg total) by mouth 2 (two) times daily with a meal. 10/02/16   Janyth Pupa, DO  ibuprofen (ADVIL,MOTRIN) 600 MG tablet Take 1 tablet (600 mg total) by  mouth every 6 (six) hours. 10/02/16   Janyth Pupa, DO  methocarbamol (ROBAXIN) 500 MG tablet Take 1 tablet (500 mg total) by mouth 2 (two) times daily. 05/29/18   Mikenzie Mccannon, Dellis Filbert, PA-C  Prenatal Vit-Fe Fumarate-FA (PRENATAL MULTIVITAMIN) TABS tablet Take 1 tablet by mouth daily at 12 noon.    [provider]    Family History Family History  Problem Relation Age of Onset  . Breast cancer Mother 8       BRCA negative. Doing fine  . Heart disease Paternal Grandmother     Social History Social History   Tobacco Use  . Smoking status: Former Smoker    Packs/day: 0.25    Types: Cigarettes    Last attempt to quit: 03/29/2016    Years since quitting: 2.1  . Smokeless tobacco: Never Used  Substance Use Topics  . Alcohol use: Yes    Alcohol/week: 4.0 standard drinks    Types: 4 Standard drinks or equivalent per week    Comment: not while preg  . Drug use: Yes    Types: Marijuana    Comment: occ     Allergies  Patient has no known allergies.   Review of Systems Review of Systems  All other systems reviewed and are negative.    Physical Exam Updated Vital Signs BP 129/86 (BP Location: Right Arm)   Pulse 84   Temp 98.3 F (36.8 C) (Oral)   Resp 16   LMP 05/29/2018 (Exact Date)   SpO2 100%   Breastfeeding No   Physical Exam Vitals signs and nursing note reviewed.  Constitutional:      Appearance: She is well-developed.  HENT:     Head: Normocephalic and atraumatic.  Eyes:     General: No scleral icterus.       Right eye: No discharge.        Left eye: No discharge.     Conjunctiva/sclera: Conjunctivae normal.     Pupils: Pupils are equal, round, and reactive to light.  Neck:     Musculoskeletal: Normal range of motion.     Vascular: No JVD.     Trachea: No tracheal deviation.  Pulmonary:     Effort: Pulmonary effort is normal.     Breath sounds: No stridor.  Musculoskeletal:     Comments: Patient with neck laterally flexed to the right,  tenderness palpation of the midline paracervical region-lateral neck soft, no bruits, nontender to palpation unable to range neck secondary to pain-bilateral upper and lower extremity sensation strength motor function intact-no sensory deficits  Neurological:     Mental Status: She is alert and oriented to person, place, and time.     Coordination: Coordination normal.  Psychiatric:        Behavior: Behavior normal.        Thought Content: Thought content normal.        Judgment: Judgment normal.      ED Treatments / Results  Labs (all labs ordered are listed, but only abnormal results are displayed) Labs Reviewed - No data to display  EKG None  Radiology Ct Cervical Spine Wo Contrast  Result Date: 05/29/2018 CLINICAL DATA:  Cervicalgia, primarily right-sided EXAM: CT CERVICAL SPINE WITHOUT CONTRAST TECHNIQUE: Multidetector CT imaging of the cervical spine was performed without intravenous contrast. Multiplanar CT image reconstructions were also generated. COMPARISON:  None. FINDINGS: Alignment: There is slight levoscoliosis in the cervical region. There is no appreciable spondylolisthesis. Skull base and vertebrae: Skull base and craniocervical junction regions appear unremarkable. There is no demonstrable fracture. No blastic or lytic bone lesions are evident. Soft tissues and spinal canal: Prevertebral soft tissues and predental space regions are normal. There is no cord or canal hematoma. No paraspinous lesions are evident. Disc levels:  There is no appreciable disc space narrowing. At C2-3, there is slight facet hypertrophy on the left. There is no nerve root edema or effacement. No disc extrusion or stenosis. At C3-4, there is no appreciable nerve root edema or effacement. No disc extrusion or stenosis. Facets appear unremarkable. At C4-5, no nerve root edema or effacement. No disc extrusion or stenosis. Facets appear normal bilaterally. At C5-6, no nerve root edema or effacement. No  disc extrusion or stenosis. Facets appear normal bilaterally. At C6-7, no nerve root edema or effacement. No disc extrusion or stenosis. There is minimal facet hypertrophy on the left. At C7-T1, no nerve root edema or effacement. No disc extrusion or stenosis. Facets appear normal bilaterally. Upper chest: Visualized upper lung regions are clear. Other: None IMPRESSION: Mild scoliosis. Suspect a degree of muscle spasm. No spondylolisthesis evident. No fracture. No nerve root edema or effacement. No disc  extrusion or stenosis. No appreciable disc space narrowing. Minimal facet hypertrophy on the left at C2-3 and at C6-7. Electronically Signed   By: Lowella Grip III M.D.   On: 05/29/2018 10:46    Procedures Procedures (including critical care time)  Medications Ordered in ED Medications  oxyCODONE-acetaminophen (PERCOCET/ROXICET) 5-325 MG per tablet 1 tablet (1 tablet Oral Given 05/29/18 0946)     Initial Impression / Assessment and Plan / ED Course  I have reviewed the triage vital signs and the nursing notes.  Pertinent labs & imaging results that were available during my care of the patient were reviewed by me and considered in my medical decision making (see chart for details).     Labs:   Imaging: CT cervical  Consults:  Therapeutics: Percocet  Discharge Meds: Robaxin  Assessment/Plan: 28 year old female presents today with cervical strain.  Patient CT with no acute bony abnormalities.  I have low suspicion for any vascular etiology.  No neurological deficits here.  Discharged with symptomatic care and strict return precautions.  She verbalized understanding and agreement to today's plan had no further questions or concerns.      Final Clinical Impressions(s) / ED Diagnoses   Final diagnoses:  Strain of neck muscle, initial encounter    ED Discharge Orders         Ordered    methocarbamol (ROBAXIN) 500 MG tablet  2 times daily     05/29/18 1158             Okey Regal, PA-C 05/29/18 1200    Hayden Rasmussen, MD 05/29/18 1843

## 2018-05-29 NOTE — ED Triage Notes (Signed)
Pt endorses left sided neck pain that began yesterday morning while brushing her hair when the pt turned her neck the wrong way causing pain. Pain has continued, did not take any meds at home.

## 2019-01-21 ENCOUNTER — Emergency Department (HOSPITAL_COMMUNITY)
Admission: EM | Admit: 2019-01-21 | Discharge: 2019-01-21 | Disposition: A | Discharge status: Home / Self Care | Payer: Self-pay | Attending: Emergency Medicine | Admitting: Emergency Medicine

## 2019-01-21 ENCOUNTER — Other Ambulatory Visit: Payer: Self-pay

## 2019-01-21 ENCOUNTER — Encounter (HOSPITAL_COMMUNITY): Payer: Self-pay | Admitting: Emergency Medicine

## 2019-01-21 DIAGNOSIS — R112 Nausea with vomiting, unspecified: Secondary | ICD-10-CM | POA: Insufficient documentation

## 2019-01-21 DIAGNOSIS — Z79899 Other long term (current) drug therapy: Secondary | ICD-10-CM | POA: Insufficient documentation

## 2019-01-21 DIAGNOSIS — Z87891 Personal history of nicotine dependence: Secondary | ICD-10-CM | POA: Insufficient documentation

## 2019-01-21 LAB — URINALYSIS, ROUTINE W REFLEX MICROSCOPIC
Bilirubin Urine: NEGATIVE
Glucose, UA: NEGATIVE mg/dL
Ketones, ur: 80 mg/dL — AB
Leukocytes,Ua: NEGATIVE
Nitrite: NEGATIVE
Protein, ur: NEGATIVE mg/dL
Specific Gravity, Urine: 1.016 (ref 1.005–1.030)
pH: 8 (ref 5.0–8.0)

## 2019-01-21 LAB — CBC WITH DIFFERENTIAL/PLATELET
Abs Immature Granulocytes: 0.01 K/uL (ref 0.00–0.07)
Basophils Absolute: 0 K/uL (ref 0.0–0.1)
Basophils Relative: 0 %
Eosinophils Absolute: 0 K/uL (ref 0.0–0.5)
Eosinophils Relative: 0 %
HCT: 41.8 % (ref 36.0–46.0)
Hemoglobin: 13.8 g/dL (ref 12.0–15.0)
Immature Granulocytes: 0 %
Lymphocytes Relative: 35 %
Lymphs Abs: 1.7 K/uL (ref 0.7–4.0)
MCH: 31.7 pg (ref 26.0–34.0)
MCHC: 33 g/dL (ref 30.0–36.0)
MCV: 96.1 fL (ref 80.0–100.0)
Monocytes Absolute: 0.4 K/uL (ref 0.1–1.0)
Monocytes Relative: 9 %
Neutro Abs: 2.7 K/uL (ref 1.7–7.7)
Neutrophils Relative %: 56 %
Platelets: 230 K/uL (ref 150–400)
RBC: 4.35 MIL/uL (ref 3.87–5.11)
RDW: 12.1 % (ref 11.5–15.5)
WBC: 4.9 K/uL (ref 4.0–10.5)
nRBC: 0 % (ref 0.0–0.2)

## 2019-01-21 LAB — COMPREHENSIVE METABOLIC PANEL WITH GFR
ALT: 13 U/L (ref 0–44)
AST: 17 U/L (ref 15–41)
Albumin: 4.9 g/dL (ref 3.5–5.0)
Alkaline Phosphatase: 37 U/L — ABNORMAL LOW (ref 38–126)
Anion gap: 11 (ref 5–15)
BUN: 10 mg/dL (ref 6–20)
CO2: 22 mmol/L (ref 22–32)
Calcium: 10 mg/dL (ref 8.9–10.3)
Chloride: 102 mmol/L (ref 98–111)
Creatinine, Ser: 0.62 mg/dL (ref 0.44–1.00)
GFR calc Af Amer: >60 mL/min (ref >60)
GFR calc non Af Amer: >60 mL/min (ref >60)
Glucose, Bld: 92 mg/dL (ref 70–99)
Potassium: 3.6 mmol/L (ref 3.5–5.1)
Sodium: 135 mmol/L (ref 135–145)
Total Bilirubin: 0.6 mg/dL (ref 0.3–1.2)
Total Protein: 8.1 g/dL (ref 6.5–8.1)

## 2019-01-21 LAB — HCG, QUANTITATIVE, PREGNANCY: hCG, Beta Chain, Quant, S: 141154 m[IU]/mL — ABNORMAL HIGH

## 2019-01-21 LAB — LIPASE, BLOOD: Lipase: 21 U/L (ref 11–51)

## 2019-01-21 MED ORDER — ONDANSETRON HCL 4 MG PO TABS: 4.0000 mg | ORAL_TABLET | Freq: Four times a day (QID) | ORAL | 0 refills | Status: DC

## 2019-01-21 MED ORDER — SODIUM CHLORIDE 0.9 % IV BOLUS
1500.0000 mL | Freq: Once | INTRAVENOUS | Status: AC
  Administered 2019-01-21: 1500 mL via INTRAVENOUS

## 2019-01-21 MED ORDER — ONDANSETRON HCL 4 MG/2ML IJ SOLN
4.0000 mg | Freq: Once | INTRAMUSCULAR | Status: AC
Start: 2019-01-21 — End: 2019-01-21
  Administered 2019-01-21: 4 mg via INTRAVENOUS
  Filled 2019-01-21: qty 2

## 2019-01-21 MED ORDER — DICYCLOMINE HCL 10 MG/ML IM SOLN
20.0000 mg | Freq: Once | INTRAMUSCULAR | Status: AC
  Administered 2019-01-21: 20 mg via INTRAMUSCULAR
  Filled 2019-01-21: qty 2

## 2019-01-21 NOTE — Discharge Instructions (Signed)
You were given a prescription for Zofran for nausea and vomiting. Please take as directed.   Please follow up with your primary doctor within the next 5-7 days.  If you do not have a primary care provider, information for a healthcare clinic has been provided for you to make arrangements for follow up care. Please return to the ER sooner if you have any new or worsening symptoms, or if you have any of the following symptoms:  Abdominal pain that does not go away.  You have a fever.  You keep throwing up (vomiting).  The pain is felt only in portions of the abdomen. Pain in the right side could possibly be appendicitis. In an adult, pain in the left lower portion of the abdomen could be colitis or diverticulitis.  You pass bloody or black tarry stools.  There is bright red blood in the stool.  The constipation stays for more than 4 days.  There is belly (abdominal) or rectal pain.  You do not seem to be getting better.  You have any questions or concerns.

## 2019-01-21 NOTE — ED Triage Notes (Signed)
Pt reports had morning sickness and vomiting since last Monday. Reports took abortion pill on Saturday but has continued to have vomiting since.

## 2019-01-21 NOTE — ED Provider Notes (Addendum)
[facility] DEPT Provider Note   CSN: [id number] Arrival date & time: 01/21/19  6967     History   Chief Complaint Chief Complaint  Patient presents with  . Emesis    HPI Brooke Vance is a 28 y.o. female.     HPI   Pt is a at 28 y/o female who presents to the ED today for eval of nausea and vomiting that began 1 week ago. States that she found out she was [redacted] weeks pregnant 5 days ago. States she went to an abortion clinic on [address] and she was given misoprostol at that time. Since then she has still had nausea and vomiting and has been unable to keep foods down. She estimates having about 10 episodes of vomiting/day for the last several days.  She c/o mild abd discomfort she describes as cramping. Denies diarrhea, constipation, dysuria, frequency, urgency, or fevers. Since then she has also had some vaginal bleeding.    Past Medical History:  Diagnosis Date  . Vaginal Pap smear, abnormal     Patient Active Problem List   Diagnosis Date Noted  . Vaginal delivery--twins 09/30/2016  . Dichorionic diamniotic twin pregnancy in third trimester 09/24/2016  . Premature cervical dilation 08/08/2016    Past Surgical History:  Procedure Laterality Date  . DILATION AND CURETTAGE OF UTERUS     abortion   . THERAPEUTIC ABORTION    . VAGINAL DELIVERY N/A 09/30/2016   Procedure: VAGINAL DELIVERY;  Surgeon: Thurnell Lose, MD;  Location: [facility];  Service: Obstetrics;  Laterality: N/A;     OB History    Gravida  2   Para  1   Term      Preterm  1   AB  1   Living  2     SAB      TAB      Ectopic      Multiple  1   Live Births  2            Home Medications    Prior to Admission medications   Medication Sig Start Date End Date Taking? Authorizing Provider  ferrous gluconate (FERGON) 324 MG tablet Take 1 tablet (324 mg total) by mouth 2 (two) times daily with a meal. Patient not taking: Reported on  01/21/2019 10/02/16   Janyth Pupa, DO  ibuprofen (ADVIL,MOTRIN) 600 MG tablet Take 1 tablet (600 mg total) by mouth every 6 (six) hours. Patient not taking: Reported on 01/21/2019 10/02/16   Janyth Pupa, DO  methocarbamol (ROBAXIN) 500 MG tablet Take 1 tablet (500 mg total) by mouth 2 (two) times daily. Patient not taking: Reported on 01/21/2019 05/29/18   Hedges, Dellis Filbert, PA-C  ondansetron (ZOFRAN) 4 MG tablet Take 1 tablet (4 mg total) by mouth every 6 (six) hours. 01/21/19   Zimere Dunlevy S, PA-C    Family History Family History  Problem Relation Age of Onset  . Breast cancer Mother 33       BRCA negative. Doing fine  . Heart disease Paternal Grandmother     Social History Social History   Tobacco Use  . Smoking status: Former Smoker    Packs/day: 0.25    Types: Cigarettes    Quit date: 03/29/2016    Years since quitting: 2.8  . Smokeless tobacco: Never Used  Substance Use Topics  . Alcohol use: Yes    Alcohol/week: 4.0 standard drinks    Types: 4 Standard drinks or equivalent per week  Comment: not while preg  . Drug use: Yes    Types: Marijuana    Comment: occ     Allergies   Patient has no known allergies.   Review of Systems Review of Systems  Constitutional: Negative for fever.  HENT: Negative for sore throat.   Eyes: Negative for visual disturbance.  Respiratory: Negative for cough and shortness of breath.   Cardiovascular: Negative for chest pain.  Gastrointestinal: Positive for abdominal pain, nausea and vomiting. Negative for constipation and diarrhea.  Genitourinary: Positive for vaginal bleeding. Negative for dysuria, frequency, hematuria, urgency and vaginal discharge.  Musculoskeletal: Negative for back pain.  Skin: Negative for rash.  Neurological: Negative for headaches.  All other systems reviewed and are negative.    Physical Exam Updated Vital Signs BP 108/67   Pulse 60   Temp 98.1 F (36.7 C)   Resp 16   SpO2 100%   Physical  Exam Vitals signs and nursing note reviewed.  Constitutional:      General: She is not in acute distress.    Appearance: She is well-developed.  HENT:     Head: Normocephalic and atraumatic.     Mouth/Throat:     Mouth: Mucous membranes are dry.  Eyes:     Conjunctiva/sclera: Conjunctivae normal.  Neck:     Musculoskeletal: Neck supple.  Cardiovascular:     Rate and Rhythm: Normal rate and regular rhythm.     Pulses: Normal pulses.     Heart sounds: Normal heart sounds. No murmur.  Pulmonary:     Effort: Pulmonary effort is normal. No respiratory distress.     Breath sounds: Normal breath sounds. No wheezing, rhonchi or rales.  Abdominal:     General: Bowel sounds are normal. There is no distension.     Palpations: Abdomen is soft.     Tenderness: There is no abdominal tenderness. There is no right CVA tenderness, left CVA tenderness, guarding or rebound.  Musculoskeletal: Normal range of motion.  Skin:    General: Skin is warm and dry.  Neurological:     Mental Status: She is alert.      ED Treatments / Results  Labs (all labs ordered are listed, but only abnormal results are displayed) Labs Reviewed  COMPREHENSIVE METABOLIC PANEL - Abnormal; Notable for the following components:      Result Value   Alkaline Phosphatase 37 (*)    All other components within normal limits  URINALYSIS, ROUTINE W REFLEX MICROSCOPIC - Abnormal; Notable for the following components:   APPearance CLOUDY (*)    Hgb urine dipstick MODERATE (*)    Ketones, ur 80 (*)    Bacteria, UA RARE (*)    All other components within normal limits  HCG, QUANTITATIVE, PREGNANCY - Abnormal; Notable for the following components:   hCG, Beta Chain, Quant, S 141,154 (*)    All other components within normal limits  CBC WITH DIFFERENTIAL/PLATELET  LIPASE, BLOOD    EKG None  Radiology No results found.  Procedures Procedures (including critical care time)  Medications Ordered in ED Medications   sodium chloride 0.9 % bolus 1,500 mL (1,500 mLs Intravenous New Bag/Given 01/21/19 1004)  dicyclomine (BENTYL) injection 20 mg (20 mg Intramuscular Given 01/21/19 1004)  ondansetron (ZOFRAN) injection 4 mg (4 mg Intravenous Given 01/21/19 1004)     Initial Impression / Assessment and Plan / ED Course  I have reviewed the triage vital signs and the nursing notes.  Pertinent labs & imaging results that were  available during my care of the patient were reviewed by me and considered in my medical decision making (see chart for details).      Final Clinical Impressions(s) / ED Diagnoses   Final diagnoses:  Non-intractable vomiting with nausea, unspecified vomiting type   28 y/o female presenting for eval of NV for 1 week. Has persisted after taking misoprostol 5 days ago.   Borderline hypotensive but wnl for pt based on chart review. Otherwise VS reassuring. Abd soft and nontender.  Patient is nontoxic, nonseptic appearing, in no apparent distress.  Patient's pain and other symptoms adequately managed in emergency department.   Labs, imaging and vitals reviewed. Labs area reassuring. UA w/o evidence for infection. Beta hcg is positive which would be expected in setting of recent abortion.   Patient does not meet the SIRS or Sepsis criteria.  On repeat exam patient does not have a surgical abdomin and there are no peritoneal signs.  No indication of appendicitis, bowel obstruction, bowel perforation, cholecystitis, diverticulitis, PID or ectopic pregnancy.    Fluid bolus given as well as antiemetics. On reassessment, she is feeling improved. She was able to tolerate Po. Continues to denies significant abd pain..   Patient discharged home with symptomatic treatment and given strict instructions for follow-up with ob-gyn.  I have also discussed reasons to return immediately to the ER.  Patient expresses understanding and agrees with plan.     ED Discharge Orders         Ordered     ondansetron (ZOFRAN) 4 MG tablet  Every 6 hours     01/21/19 0928           Rodney Booze, PA-C 01/21/19 [address], [place], PA-C 01/21/19 1119    Virgel Manifold, MD 01/21/19 1243

## 2019-09-05 ENCOUNTER — Encounter (HOSPITAL_COMMUNITY): Payer: Self-pay | Admitting: Emergency Medicine

## 2019-09-05 ENCOUNTER — Other Ambulatory Visit: Payer: Self-pay

## 2019-09-05 ENCOUNTER — Emergency Department (HOSPITAL_COMMUNITY)
Admission: EM | Admit: 2019-09-05 | Discharge: 2019-09-05 | Disposition: A | Discharge status: Home / Self Care | Payer: No Typology Code available for payment source | Attending: Emergency Medicine | Admitting: Emergency Medicine

## 2019-09-05 ENCOUNTER — Emergency Department (HOSPITAL_COMMUNITY): Payer: No Typology Code available for payment source

## 2019-09-05 DIAGNOSIS — Z87891 Personal history of nicotine dependence: Secondary | ICD-10-CM | POA: Insufficient documentation

## 2019-09-05 DIAGNOSIS — S0083XA Contusion of other part of head, initial encounter: Secondary | ICD-10-CM | POA: Insufficient documentation

## 2019-09-05 DIAGNOSIS — Y999 Unspecified external cause status: Secondary | ICD-10-CM | POA: Insufficient documentation

## 2019-09-05 DIAGNOSIS — Y929 Unspecified place or not applicable: Secondary | ICD-10-CM | POA: Insufficient documentation

## 2019-09-05 DIAGNOSIS — Y9389 Activity, other specified: Secondary | ICD-10-CM | POA: Insufficient documentation

## 2019-09-05 DIAGNOSIS — S0993XA Unspecified injury of face, initial encounter: Secondary | ICD-10-CM | POA: Diagnosis present

## 2019-09-05 DIAGNOSIS — R519 Headache, unspecified: Secondary | ICD-10-CM | POA: Diagnosis not present

## 2019-09-05 DIAGNOSIS — R55 Syncope and collapse: Secondary | ICD-10-CM | POA: Diagnosis not present

## 2019-09-05 DIAGNOSIS — R600 Localized edema: Secondary | ICD-10-CM | POA: Diagnosis not present

## 2019-09-05 MED ORDER — OXYCODONE-ACETAMINOPHEN 5-325 MG PO TABS
1.0000 | ORAL_TABLET | Freq: Once | ORAL | Status: AC
  Administered 2019-09-05: 1 via ORAL
  Filled 2019-09-05: qty 1

## 2019-09-05 MED ORDER — NAPROXEN 500 MG PO TABS: 500.0000 mg | ORAL_TABLET | Freq: Two times a day (BID) | ORAL | 0 refills | Status: DC
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
## 2019-09-05 NOTE — ED Notes (Signed)
Patient verbalizes understanding of discharge instructions. Opportunity for questioning and answers were provided. Armband removed by staff, pt discharged from ED. Ambulated out to lobby  
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
## 2019-09-05 NOTE — ED Notes (Signed)
Pt taken to CT.

## 2019-09-10 ENCOUNTER — Encounter: Payer: Self-pay | Admitting: Family

## 2019-09-10 ENCOUNTER — Other Ambulatory Visit: Payer: Self-pay

## 2019-09-10 ENCOUNTER — Ambulatory Visit (INDEPENDENT_AMBULATORY_CARE_PROVIDER_SITE_OTHER): Payer: Self-pay | Admitting: Family

## 2019-09-10 VITALS — BP 126/80 | HR 80 | Temp 97.5°F | Resp 16 | Ht 67.0 in | Wt 139.4 lb

## 2019-09-10 DIAGNOSIS — K130 Diseases of lips: Secondary | ICD-10-CM

## 2019-09-10 DIAGNOSIS — Z0289 Encounter for other administrative examinations: Secondary | ICD-10-CM

## 2019-09-10 DIAGNOSIS — S0511XD Contusion of eyeball and orbital tissues, right eye, subsequent encounter: Secondary | ICD-10-CM

## 2019-09-10 DIAGNOSIS — R220 Localized swelling, mass and lump, head: Secondary | ICD-10-CM

## 2019-09-10 DIAGNOSIS — H5789 Other specified disorders of eye and adnexa: Secondary | ICD-10-CM

## 2019-09-10 DIAGNOSIS — R519 Headache, unspecified: Secondary | ICD-10-CM

## 2019-09-10 LAB — BASIC METABOLIC PANEL WITH GFR
BUN: 13 mg/dL (ref 7–25)
CO2: 27 mmol/L (ref 20–32)
Calcium: 10.1 mg/dL (ref 8.6–10.2)
Chloride: 108 mmol/L (ref 98–110)
Creat: 0.75 mg/dL (ref 0.50–1.10)
GFR, Est African American: 126 mL/min/{1.73_m2} (ref >=60)
GFR, Est Non African American: 108 mL/min/{1.73_m2} (ref >=60)
Glucose, Bld: 102 mg/dL — ABNORMAL HIGH (ref 65–99)
Potassium: 4.4 mmol/L (ref 3.5–5.3)
Sodium: 140 mmol/L (ref 135–146)

## 2019-09-10 LAB — CBC WITH DIFFERENTIAL/PLATELET
Absolute Monocytes: 385 cells/uL (ref 200–950)
Basophils Absolute: 21 cells/uL (ref 0–200)
Basophils Relative: 0.4 %
Eosinophils Absolute: 42 cells/uL (ref 15–500)
Eosinophils Relative: 0.8 %
HCT: 37.2 % (ref 35.0–45.0)
Hemoglobin: 12.2 g/dL (ref 11.7–15.5)
Lymphs Abs: 1804 cells/uL (ref 850–3900)
MCH: 31.3 pg (ref 27.0–33.0)
MCHC: 32.8 g/dL (ref 32.0–36.0)
MCV: 95.4 fL (ref 80.0–100.0)
MPV: 10.3 fL (ref 7.5–12.5)
Monocytes Relative: 7.4 %
Neutro Abs: 2948 cells/uL (ref 1500–7800)
Neutrophils Relative %: 56.7 %
Platelets: 273 10*3/uL (ref 140–400)
RBC: 3.9 10*6/uL (ref 3.80–5.10)
RDW: 13.1 % (ref 11.0–15.0)
Total Lymphocyte: 34.7 %
WBC: 5.2 10*3/uL (ref 3.8–10.8)
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
## 2019-09-10 NOTE — Patient Instructions (Addendum)
-   May return to work in one week  - Notify provider if symptoms worse or fail to improve  - Continue on Naproxen twice daily with food.  - labs drawn today will call you with results.

## 2019-09-25 ENCOUNTER — Encounter: Payer: Self-pay | Admitting: Family

## 2019-09-25 ENCOUNTER — Ambulatory Visit (INDEPENDENT_AMBULATORY_CARE_PROVIDER_SITE_OTHER): Payer: Self-pay | Admitting: Family

## 2019-09-25 ENCOUNTER — Other Ambulatory Visit: Payer: Self-pay

## 2019-09-25 VITALS — BP 128/80 | HR 79 | Temp 98.7°F | Ht 67.0 in | Wt 135.5 lb

## 2019-09-25 DIAGNOSIS — S0511XA Contusion of eyeball and orbital tissues, right eye, initial encounter: Secondary | ICD-10-CM

## 2019-09-25 DIAGNOSIS — Z0289 Encounter for other administrative examinations: Secondary | ICD-10-CM

## 2019-09-25 DIAGNOSIS — G44329 Chronic post-traumatic headache, not intractable: Secondary | ICD-10-CM

## 2019-09-25 DIAGNOSIS — R519 Headache, unspecified: Secondary | ICD-10-CM | POA: Insufficient documentation

## 2019-09-25 DIAGNOSIS — Z113 Encounter for screening for infections with a predominantly sexual mode of transmission: Secondary | ICD-10-CM

## 2019-09-25 DIAGNOSIS — R220 Localized swelling, mass and lump, head: Secondary | ICD-10-CM

## 2019-09-25 DIAGNOSIS — S0510XA Contusion of eyeball and orbital tissues, unspecified eye, initial encounter: Secondary | ICD-10-CM | POA: Insufficient documentation

## 2019-09-25 NOTE — Patient Instructions (Signed)
[employer] requested forms completed will be faxed.
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
## 2019-09-26 LAB — HIV ANTIBODY (ROUTINE TESTING W REFLEX): HIV 1&2 Ab, 4th Generation: NONREACTIVE

## 2019-09-26 LAB — C. TRACHOMATIS/N. GONORRHOEAE RNA
C. trachomatis RNA, TMA: NOT DETECTED
N. gonorrhoeae RNA, TMA: NOT DETECTED

## 2020-01-12 ENCOUNTER — Encounter: Payer: Self-pay | Admitting: Family

## 2020-01-13 ENCOUNTER — Telehealth: Payer: Self-pay | Admitting: Family

## 2020-01-13 NOTE — Telephone Encounter (Signed)
I called the patient to see if she wanted to reschedule her PHY from 01/12/20 with Dinah. I left her a vm.

## 2020-07-07 IMAGING — CT CT HEAD W/O CM
4 series · 15 of 47 positions shown, 17 images · non-contrast
Comparison: None.

CLINICAL DATA: Initial evaluation for acute facial trauma, upper
lip swelling and right facial pain. By

EXAM:
CT HEAD WITHOUT CONTRAST
CT MAXILLOFACIAL WITHOUT CONTRAST
TECHNIQUE: Multidetector CT imaging of the head and maxillofacial structures
were performed using the standard protocol without intravenous
contrast. Multiplanar CT image reconstructions of the maxillofacial
structures were also generated.

[Series 3: head without · axial · non-contrast · 0.44mm/px · z∈[-151,-31]mm · 7 of 32 slices shown, 9 images]
[im 4/32  brain]
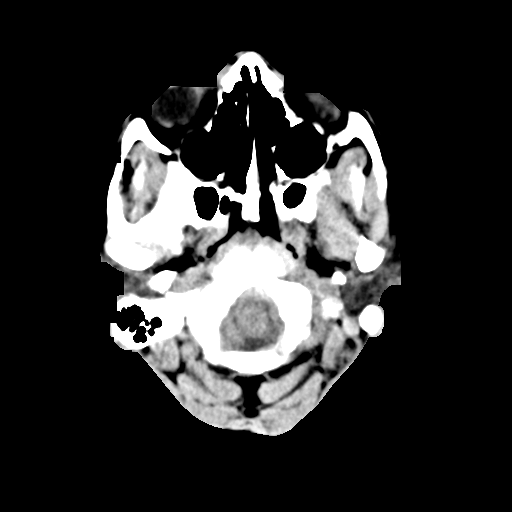
[im 4/32  bone]
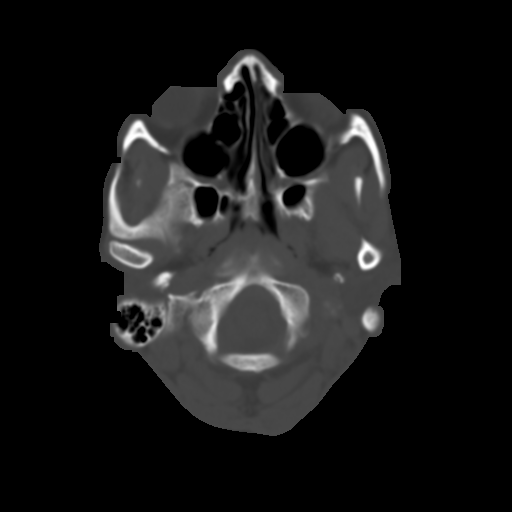
[im 8/32  brain]
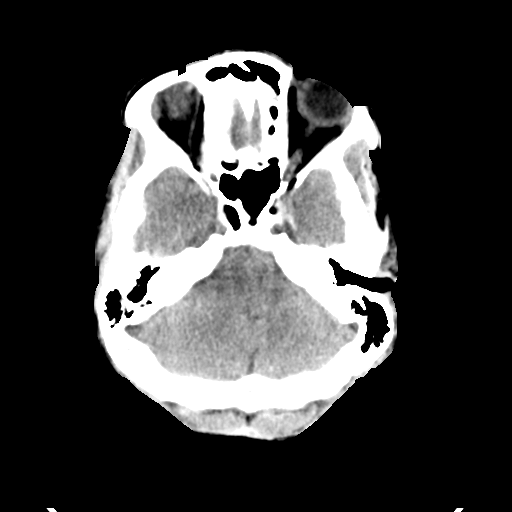
[im 12/32  brain]
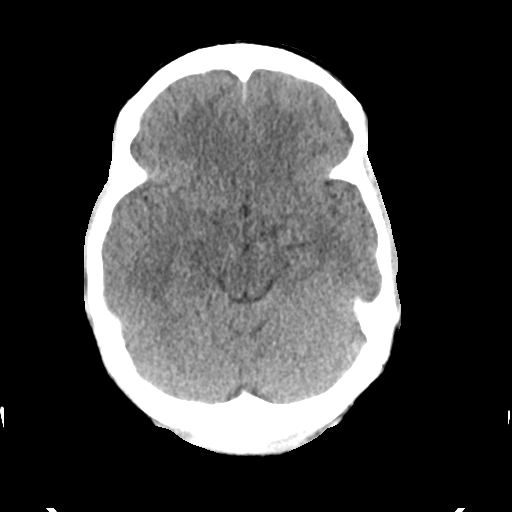
[im 16/32  brain]
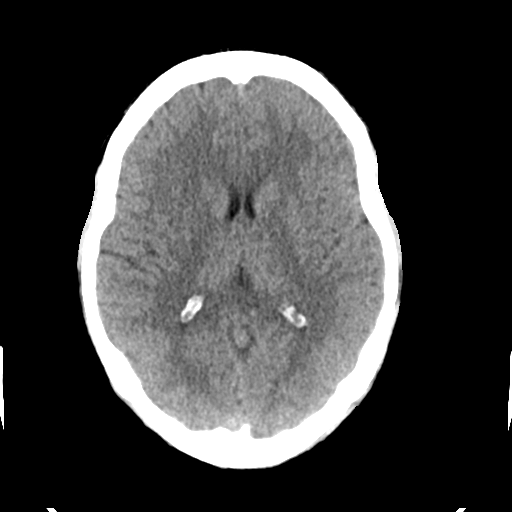
[im 20/32  brain]
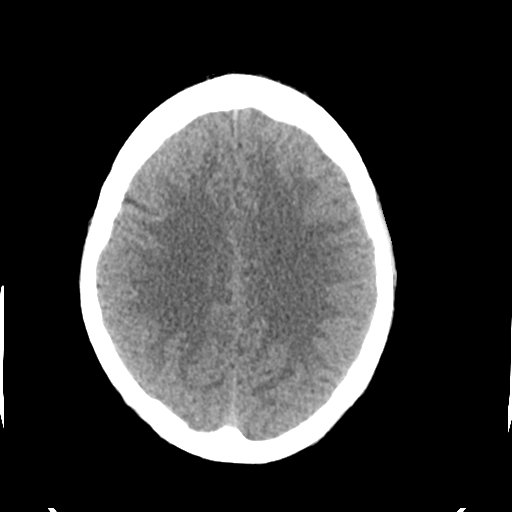
[im 20/32  bone]
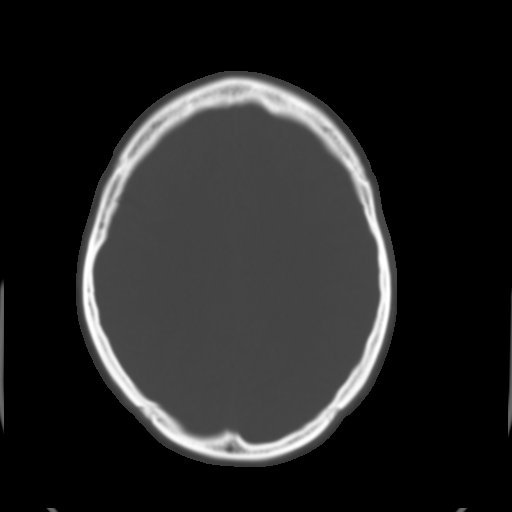
[im 24/32  brain]
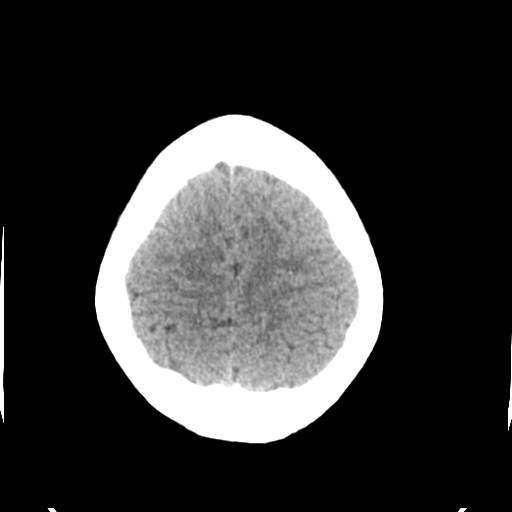
[im 28/32  brain]
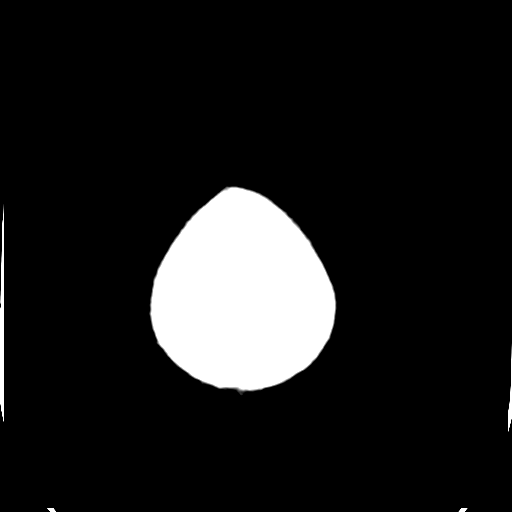

[Series 4: head bone · axial · 0.44mm/px · z∈[-152,-136]mm · 2 of 79 slices shown]
[im 8/79  bone]
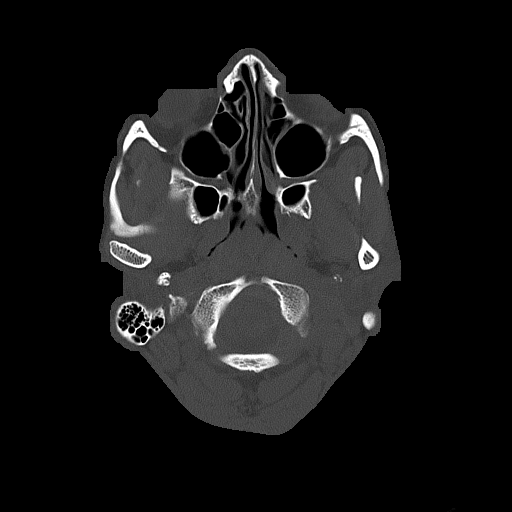
[im 16/79  bone]
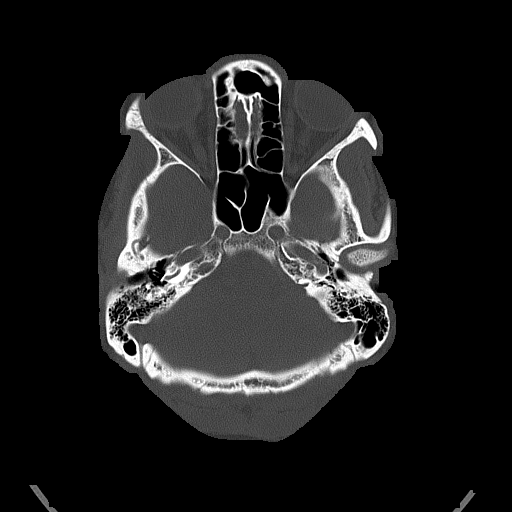

[Series 5: head without cor · coronal · non-contrast · 0.30mm/px · 3 of 69 slices shown]
[im 23/69  brain]
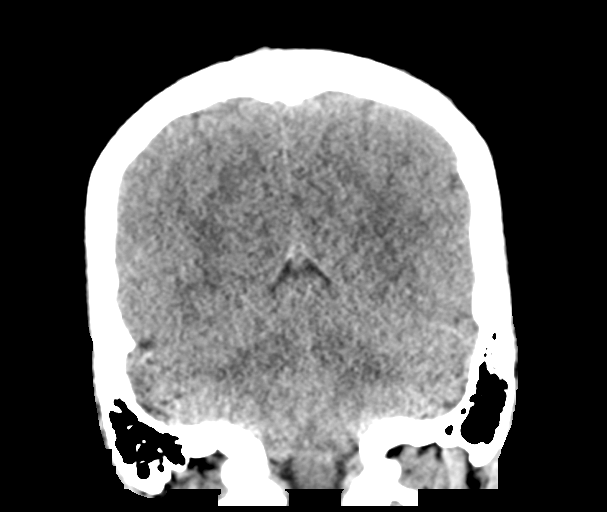
[im 31/69  brain]
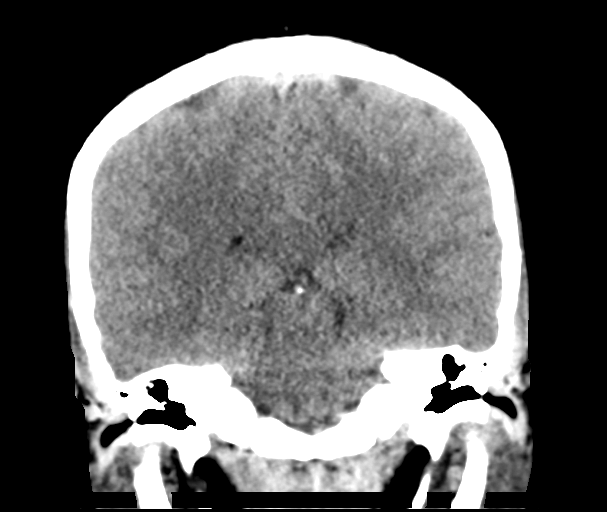
[im 38/69  brain]
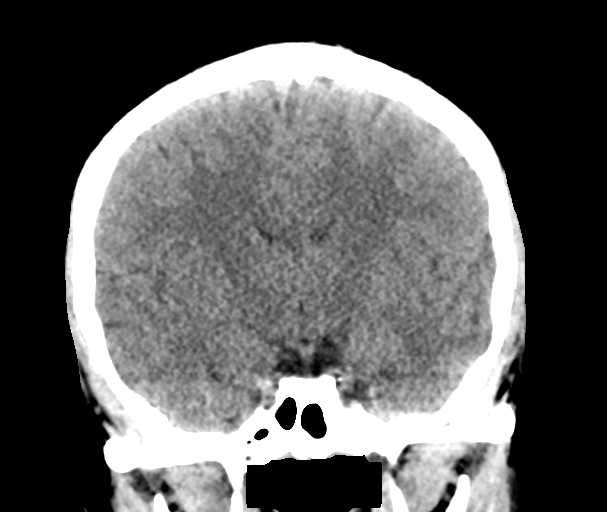

[Series 6: head without sag · sagittal · non-contrast · 0.30mm/px · 3 of 67 slices shown]
[im 23/67  brain]
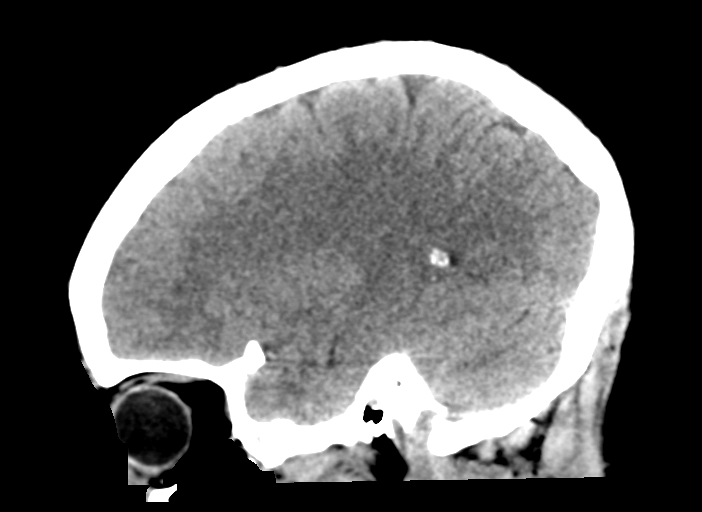
[im 34/67  brain]
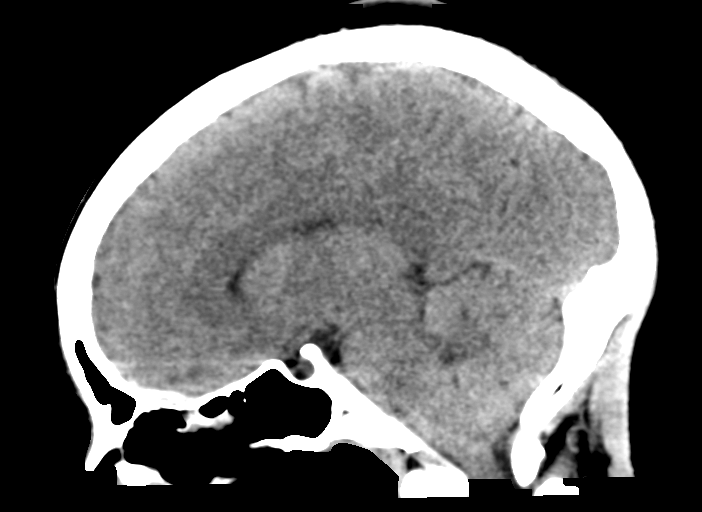
[im 45/67  brain]
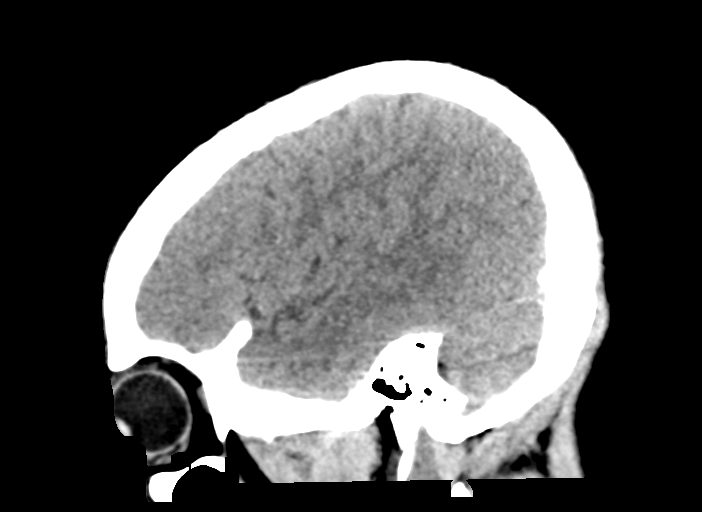

[15 of 47 positions shown; findings below may reference images not displayed]

FINDINGS: CT HEAD FINDINGS

Brain: Cerebral volume within normal limits. No acute intracranial
hemorrhage. No acute large vessel territory infarct. No mass lesion,
midline shift or mass effect. No hydrocephalus or extra-axial fluid
collection.

Vascular: No hyperdense vessel.

Skull: Scalp soft tissues within normal limits. Calvarium intact.

Other: Mastoid air cells are clear.

CT MAXILLOFACIAL FINDINGS

Osseous: Zygomatic arches intact. No acute maxillary fracture.
Pterygoid plates intact. No acute nasal bone fracture. Right-to-left
nasal septal deviation without associated fracture or concha
bullosa. Mandible intact. Mandibular condyles normally situated. No
acute abnormality about the dentition.

Orbits: Globes and orbital soft tissues within normal limits. Bony
orbits intact.

Sinuses: Mild scattered mucosal thickening noted within the
ethmoidal air cells in right sphenoid sinus. Paranasal sinuses are
otherwise clear. No hemosinus.

Soft tissues: Soft tissue swelling and contusion seen involving the
right infraorbital and right facial soft tissues. Extension towards
the right nasal bridge. No other visible soft tissue abnormality.
IMPRESSION: 1. Negative head CT. No acute intracranial abnormality.
2. Right infraorbital and right facial soft tissue swelling and
contusion. No fracture. Intact globes with no retro-orbital
pathology.

## 2020-11-03 ENCOUNTER — Emergency Department (HOSPITAL_COMMUNITY)
Admission: EM | Admit: 2020-11-03 | Discharge: 2020-11-04 | Disposition: A | Discharge status: Home / Self Care | Payer: Medicaid Other | Attending: Emergency Medicine | Admitting: Emergency Medicine

## 2020-11-03 ENCOUNTER — Other Ambulatory Visit: Payer: Self-pay

## 2020-11-03 DIAGNOSIS — R112 Nausea with vomiting, unspecified: Secondary | ICD-10-CM | POA: Insufficient documentation

## 2020-11-03 DIAGNOSIS — R1030 Lower abdominal pain, unspecified: Secondary | ICD-10-CM | POA: Insufficient documentation

## 2020-11-03 DIAGNOSIS — N760 Acute vaginitis: Secondary | ICD-10-CM | POA: Insufficient documentation

## 2020-11-03 DIAGNOSIS — B9689 Other specified bacterial agents as the cause of diseases classified elsewhere: Secondary | ICD-10-CM

## 2020-11-03 DIAGNOSIS — F17210 Nicotine dependence, cigarettes, uncomplicated: Secondary | ICD-10-CM | POA: Insufficient documentation

## 2020-11-03 DIAGNOSIS — N898 Other specified noninflammatory disorders of vagina: Secondary | ICD-10-CM | POA: Diagnosis present

## 2020-11-03 LAB — COMPREHENSIVE METABOLIC PANEL
ALT: 15 U/L (ref 0–44)
AST: 21 U/L (ref 15–41)
Albumin: 3.9 g/dL (ref 3.5–5.0)
Alkaline Phosphatase: 43 U/L (ref 38–126)
Anion gap: 8 (ref 5–15)
BUN: 12 mg/dL (ref 6–20)
CO2: 25 mmol/L (ref 22–32)
Calcium: 9.4 mg/dL (ref 8.9–10.3)
Chloride: 105 mmol/L (ref 98–111)
Creatinine, Ser: 0.84 mg/dL (ref 0.44–1.00)
GFR, Estimated: >60 mL/min (ref >60)
Glucose, Bld: 112 mg/dL — ABNORMAL HIGH (ref 70–99)
Potassium: 3.5 mmol/L (ref 3.5–5.1)
Sodium: 138 mmol/L (ref 135–145)
Total Bilirubin: 0.5 mg/dL (ref 0.3–1.2)
Total Protein: 7 g/dL (ref 6.5–8.1)

## 2020-11-03 LAB — CBC
HCT: 35.6 % — ABNORMAL LOW (ref 36.0–46.0)
Hemoglobin: 11.5 g/dL — ABNORMAL LOW (ref 12.0–15.0)
MCH: 30.1 pg (ref 26.0–34.0)
MCHC: 32.3 g/dL (ref 30.0–36.0)
MCV: 93.2 fL (ref 80.0–100.0)
Platelets: 252 10*3/uL (ref 150–400)
RBC: 3.82 MIL/uL — ABNORMAL LOW (ref 3.87–5.11)
RDW: 13.2 % (ref 11.5–15.5)
WBC: 3.2 10*3/uL — ABNORMAL LOW (ref 4.0–10.5)
nRBC: 0 % (ref 0.0–0.2)

## 2020-11-03 LAB — URINALYSIS, ROUTINE W REFLEX MICROSCOPIC
Bilirubin Urine: NEGATIVE
Glucose, UA: NEGATIVE mg/dL
Hgb urine dipstick: NEGATIVE
Ketones, ur: 5 mg/dL — AB
Leukocytes,Ua: NEGATIVE
Nitrite: NEGATIVE
Protein, ur: NEGATIVE mg/dL
Specific Gravity, Urine: 1.021 (ref 1.005–1.030)
pH: 6 (ref 5.0–8.0)

## 2020-11-03 LAB — I-STAT BETA HCG BLOOD, ED (MC, WL, AP ONLY): I-stat hCG, quantitative: <5 m[IU]/mL (ref <5)

## 2020-11-03 LAB — LIPASE, BLOOD: Lipase: 51 U/L (ref 11–51)

## 2020-11-03 NOTE — ED Notes (Signed)
Called pt to go to a room, no response pt not in lobby.

## 2020-11-03 NOTE — ED Triage Notes (Signed)
Lower abdominal pain and vomiting x 2 days with dysuria.

## 2020-11-04 LAB — WET PREP, GENITAL
Sperm: NONE SEEN
Trich, Wet Prep: NONE SEEN
Yeast Wet Prep HPF POC: NONE SEEN

## 2020-11-04 LAB — GC/CHLAMYDIA PROBE AMP (~~LOC~~) NOT AT ARMC
Chlamydia: NEGATIVE
Comment: NEGATIVE
Comment: NORMAL
Neisseria Gonorrhea: NEGATIVE

## 2020-11-04 MED ORDER — ONDANSETRON 4 MG PO TBDP
4.0000 mg | ORAL_TABLET | Freq: Once | ORAL | Status: AC
  Administered 2020-11-04: 4 mg via ORAL
  Filled 2020-11-04: qty 1

## 2020-11-04 MED ORDER — METRONIDAZOLE 500 MG PO TABS: 500.0000 mg | ORAL_TABLET | Freq: Two times a day (BID) | ORAL | 0 refills | Status: AC

## 2020-11-04 MED ORDER — ONDANSETRON 4 MG PO TBDP: 4.0000 mg | ORAL_TABLET | Freq: Three times a day (TID) | ORAL | 0 refills | Status: AC | PRN

## 2020-11-04 NOTE — Discharge Instructions (Addendum)
You were seen in the emergency department with abdominal pain, nausea with vomiting.  Your exam today did show some bacterial vaginosis and I am starting you on antibiotics for this.  Have also called in some medication for nausea to your pharmacy.  Please take the complete course of antibiotic medication.  Please follow closely with your primary care doctor.

## 2020-11-04 NOTE — ED Notes (Addendum)
Pt given crackers and drink. Pt tolerated well.

## 2020-11-04 NOTE — ED Provider Notes (Signed)
Emergency Department Provider Note   I have reviewed the triage vital signs and the nursing notes.   HISTORY  Chief Complaint No chief complaint on file.   HPI Brooke Vance is a 30 y.o. female with past medical history reviewed below presents to the emergency department with suprapubic abdominal pain with some nausea and vomiting.  Patient has had 2 days of symptoms.  She describes a constant, sharp pain which occasionally gets worse without clear provocation.  No radiation of symptoms or other modifying factors.  She thought she might be getting a urine infection and has noticed some vaginal discharge.  She tried a vaginal suppository which she has used in the past and is unsure if this might be causing symptoms.  No bleeding.  No fevers or chills.  No upper respiratory infection symptoms.   Past Medical History:  Diagnosis Date   Vaginal Pap smear, abnormal     Patient Active Problem List   Diagnosis Date Noted   Headache 09/25/2019   Swollen upper lip 09/25/2019   Eye bruise 09/25/2019   Vaginal delivery--twins 09/30/2016   Dichorionic diamniotic twin pregnancy in third trimester 09/24/2016   Premature cervical dilation 08/08/2016    Past Surgical History:  Procedure Laterality Date   DILATION AND CURETTAGE OF UTERUS     abortion    THERAPEUTIC ABORTION     VAGINAL DELIVERY N/A 09/30/2016   Procedure: VAGINAL DELIVERY;  Surgeon: Thurnell Lose, MD;  Location: [facility];  Service: Obstetrics;  Laterality: N/A;    Allergies Other  Family History  Problem Relation Age of Onset   Breast cancer Mother 84       BRCA negative. Doing fine   Heart disease Paternal Grandmother     Social History Social History   Tobacco Use   Smoking status: Every Day    Packs/day: 1.00    Types: Cigarettes    Last attempt to quit: 03/29/2016    Years since quitting: 4.6   Smokeless tobacco: Never   Tobacco comments:    1 pack every 3 days.  Substance Use  Topics   Alcohol use: Yes    Alcohol/week: 4.0 standard drinks    Types: 4 Standard drinks or equivalent per week    Comment: weekly.   Drug use: Yes    Types: Marijuana    Comment: occ    Review of Systems  Constitutional: No fever/chills Eyes: No visual changes. ENT: No sore throat. Cardiovascular: Denies chest pain. Respiratory: Denies shortness of breath. Gastrointestinal: Positive suprapubic abdominal pain. Positive nausea and vomiting.  No diarrhea.  No constipation. Genitourinary: Negative for dysuria. Musculoskeletal: Negative for back pain. Skin: Negative for rash. Neurological: Negative for headaches, focal weakness or numbness.  10-point ROS otherwise negative.  ____________________________________________   PHYSICAL EXAM:  VITAL SIGNS: ED Triage Vitals  Enc Vitals Group     BP 11/03/20 0650 (!) 128/92     Pulse Rate 11/03/20 0650 66     Resp 11/03/20 0650 16     Temp 11/03/20 0650 98.5 F (36.9 C)     Temp Source 11/03/20 0650 Oral     SpO2 11/03/20 0650 100 %     Weight 11/03/20 0639 135 lb 9.3 oz (61.5 kg)     Height 11/03/20 0639 5' 7"  (1.702 m)   Constitutional: Alert and oriented. Well appearing and in no acute distress. Eyes: Conjunctivae are normal.  Head: Atraumatic. Nose: No congestion/rhinnorhea. Mouth/Throat: Mucous membranes are moist.   Neck: No  stridor.   Cardiovascular: Normal rate, regular rhythm. Good peripheral circulation. Grossly normal heart sounds.   Respiratory: Normal respiratory effort.  No retractions. Lungs CTAB. Gastrointestinal: Soft and nontender. No distention.  GU: Pelvic exam performed with patient's verbal consent and nurse chaperone. Moderate discharge. No bleeding. No CMT. No adnexal masses.  Musculoskeletal: No lower extremity tenderness nor edema. No gross deformities of extremities. Neurologic:  Normal speech and language. No gross focal neurologic deficits are appreciated.  Skin:  Skin is warm, dry and intact.  No rash noted.   ____________________________________________   LABS (all labs ordered are listed, but only abnormal results are displayed)  Labs Reviewed  WET PREP, GENITAL - Abnormal; Notable for the following components:      Result Value   Clue Cells Wet Prep HPF POC PRESENT (*)    WBC, Wet Prep HPF POC FEW (*)    All other components within normal limits  COMPREHENSIVE METABOLIC PANEL - Abnormal; Notable for the following components:   Glucose, Bld 112 (*)    All other components within normal limits  CBC - Abnormal; Notable for the following components:   WBC 3.2 (*)    RBC 3.82 (*)    Hemoglobin 11.5 (*)    HCT 35.6 (*)    All other components within normal limits  URINALYSIS, ROUTINE W REFLEX MICROSCOPIC - Abnormal; Notable for the following components:   Ketones, ur 5 (*)    All other components within normal limits  LIPASE, BLOOD  I-STAT BETA HCG BLOOD, ED ([facility], [facility], [facility] ONLY)  GC/CHLAMYDIA PROBE AMP ([place]) NOT AT [facility]   ____________________________________________  RADIOLOGY  None  ____________________________________________   PROCEDURES  Procedure(s) performed:   Procedures  None  ____________________________________________   INITIAL IMPRESSION / ASSESSMENT AND PLAN / ED COURSE  Pertinent labs & imaging results that were available during my care of the patient were reviewed by me and considered in my medical decision making (see chart for details).   Patient presents to the emergency department with lower abdominal pain with some vaginal discharge.  No evidence of urinary tract infection.  No significant tenderness on abdominal exam.  Doubt acute appendicitis, cholecystitis, colitis.  Plan for pelvic exam and reassess.  Pelvic exam performed. Exam and wet prep consistent with BV. Plan for Flagyl and Zofran. Patient feeling well and tolerating PO.  ____________________________________________  FINAL CLINICAL IMPRESSION(S) / ED  DIAGNOSES  Final diagnoses:  Lower abdominal pain  Non-intractable vomiting with nausea, unspecified vomiting type  Bacterial vaginosis     MEDICATIONS GIVEN DURING THIS VISIT:  Medications  ondansetron (ZOFRAN-ODT) disintegrating tablet 4 mg (4 mg Oral Given 11/04/20 0154)     NEW OUTPATIENT MEDICATIONS STARTED DURING THIS VISIT:  Discharge Medication List as of 11/04/2020  2:34 AM     START taking these medications   Details  metroNIDAZOLE (FLAGYL) 500 MG tablet Take 1 tablet (500 mg total) by mouth 2 (two) times daily for 7 days., Starting Fri 11/04/2020, Until Fri 11/11/2020, Normal    ondansetron (ZOFRAN ODT) 4 MG disintegrating tablet Take 1 tablet (4 mg total) by mouth every 8 (eight) hours as needed for nausea or vomiting., Starting Fri 11/04/2020, Normal        Note:  This document was prepared using Dragon voice recognition software and may include unintentional dictation errors.  Nanda Quinton, MD, [facility] Emergency Medicine    Lindzey Zent, Wonda Olds, MD 11/04/20 [phone]

## 2021-03-31 ENCOUNTER — Ambulatory Visit (HOSPITAL_COMMUNITY)
Admission: EM | Admit: 2021-03-31 | Discharge: 2021-03-31 | Disposition: A | Discharge status: Home / Self Care | Payer: Medicaid Other | Attending: Emergency Medicine | Admitting: Emergency Medicine

## 2021-03-31 ENCOUNTER — Encounter (HOSPITAL_COMMUNITY): Payer: Self-pay

## 2021-03-31 ENCOUNTER — Other Ambulatory Visit: Payer: Self-pay

## 2021-03-31 DIAGNOSIS — S0181XA Laceration without foreign body of other part of head, initial encounter: Secondary | ICD-10-CM

## 2021-03-31 MED ORDER — NAPROXEN 500 MG PO TABS: 500.0000 mg | ORAL_TABLET | Freq: Two times a day (BID) | ORAL | 0 refills | Status: DC

## 2021-03-31 NOTE — ED Triage Notes (Signed)
Pt presents with facial lacerations. Pt states she fell down steps yesterday and states she fell face forward on cement. States she has a small headache.

## 2021-03-31 NOTE — Discharge Instructions (Signed)
Your lacerations have been closed as best they could with skin glue, after few days this may fall off which is okay, if area has not begin to completely close you may attempt to close again with liquid Band-Aid adhesive or you can leave open either way it will continue to heal and close on its own  Watch for signs of infection such as increased pain, tenderness, swelling, drainage, fever, chills, it is low risk that this will occur  You may continue to wash face as normal, try to avoid scrubbing in the affected areas  You may apply ice over the next 24 hours and 15-minute intervals to help reduce swelling  I have prescribed for you naproxen which I recommend you taking twice a day for the next 3 to 5 days to help reduce swelling and for comfort

## 2021-03-31 NOTE — ED Provider Notes (Signed)
[place]    CSN: [id number] Arrival date & time: 03/31/21  6659      History   Chief Complaint Chief Complaint  Patient presents with   Laceration    HPI Brooke Vance is a 30 y.o. female.   Patient presents with a laceration over left eyebrow.  For 1 day after falling down steps.  Endorses that she fell flat onto cement.  Has cleansed and covered with nonadherent dressing.  Has not attempted further treatment.  Denies drainage, fever, chills, blurred vision, floaters, light sensitivity.  Past Medical History:  Diagnosis Date   Vaginal Pap smear, abnormal     Patient Active Problem List   Diagnosis Date Noted   Headache 09/25/2019   Swollen upper lip 09/25/2019   Eye bruise 09/25/2019   Vaginal delivery--twins 09/30/2016   Dichorionic diamniotic twin pregnancy in third trimester 09/24/2016   Premature cervical dilation 08/08/2016    Past Surgical History:  Procedure Laterality Date   DILATION AND CURETTAGE OF UTERUS     abortion    THERAPEUTIC ABORTION     VAGINAL DELIVERY N/A 09/30/2016   Procedure: VAGINAL DELIVERY;  Surgeon: Thurnell Lose, MD;  Location: [place];  Service: Obstetrics;  Laterality: N/A;    OB History     Gravida  2   Para  1   Term      Preterm  1   AB  1   Living  2      SAB      IAB      Ectopic      Multiple  1   Live Births  2            Home Medications    Prior to Admission medications   Medication Sig Start Date End Date Taking? Authorizing Provider  naproxen (NAPROSYN) 500 MG tablet Take 1 tablet (500 mg total) by mouth 2 (two) times daily. 03/31/21  Yes Yarelli Decelles R, NP  acetaminophen (TYLENOL) 500 MG tablet Take 500 mg by mouth as needed.    [provider]  ibuprofen (ADVIL) 600 MG tablet Take 600 mg by mouth as needed.    [provider]  ondansetron (ZOFRAN ODT) 4 MG disintegrating tablet Take 1 tablet (4 mg total) by mouth every 8 (eight) hours  as needed for nausea or vomiting. 11/04/20   Long, Wonda Olds, MD    Family History Family History  Problem Relation Age of Onset   Breast cancer Mother 65       BRCA negative. Doing fine   Heart disease Paternal Grandmother     Social History Social History   Tobacco Use   Smoking status: Every Day    Packs/day: 1.00    Types: Cigarettes    Last attempt to quit: 03/29/2016    Years since quitting: 5.0   Smokeless tobacco: Never   Tobacco comments:    1 pack every 3 days.  Substance Use Topics   Alcohol use: Yes    Alcohol/week: 4.0 standard drinks    Types: 4 Standard drinks or equivalent per week    Comment: weekly.   Drug use: Yes    Types: Marijuana    Comment: occ     Allergies   Other   Review of Systems Review of Systems  Constitutional: Negative.   Respiratory: Negative.    Cardiovascular: Negative.   Skin:  Positive for wound. Negative for color change, pallor and rash.  Neurological: Negative.  Physical Exam Triage Vital Signs ED Triage Vitals  Enc Vitals Group     BP 03/31/21 1004 122/79     Pulse Rate 03/31/21 1004 92     Resp 03/31/21 1004 19     Temp 03/31/21 1004 98 F (36.7 C)     Temp Source 03/31/21 1004 Oral     SpO2 03/31/21 1004 98 %     Weight --      Height --      Head Circumference --      Peak Flow --      Pain Score 03/31/21 1003 0     Pain Loc --      Pain Edu? --      Excl. in [place]? --    No data found.  Updated Vital Signs BP 122/79 (BP Location: Right Arm)    Pulse 92    Temp 98 F (36.7 C) (Oral)    Resp 19    LMP 03/09/2021 (Exact Date)    SpO2 98%   Visual Acuity Right Eye Distance:   Left Eye Distance:   Bilateral Distance:    Right Eye Near:   Left Eye Near:    Bilateral Near:     Physical Exam Constitutional:      Appearance: Normal appearance. She is normal weight.  HENT:     Head: Normocephalic.      Comments: 1x1 laceration above left eyebrow, 2 cm laceration left cheek Eyes:      Extraocular Movements: Extraocular movements intact.  Neurological:     Mental Status: She is alert.  Psychiatric:        Mood and Affect: Mood normal.        Behavior: Behavior normal.     UC Treatments / Results  Labs (all labs ordered are listed, but only abnormal results are displayed) Labs Reviewed - No data to display  EKG   Radiology No results found.  Procedures Laceration Repair  Date/Time: 03/31/2021 11:01 AM Performed by: Hans Eden, NP Authorized by: Hans Eden, NP   Consent:    Consent obtained:  Verbal   Consent given by:  Patient   Risks, benefits, and alternatives were discussed: yes     Risks discussed:  Infection   Alternatives discussed:  No treatment Universal protocol:    Procedure explained and questions answered to patient or proxy's satisfaction: yes     Patient identity confirmed:  Verbally with patient Anesthesia:    Anesthesia method:  None Laceration details:    Location:  Face   Face location:  L eyebrow (left cheek)   Length (cm):  1 (2) Pre-procedure details:    Preparation:  Patient was prepped and draped in usual sterile fashion Exploration:    Limited defect created (wound extended): no     Wound exploration: entire depth of wound visualized     Contaminated: no   Treatment:    Area cleansed with:  Povidone-iodine   Amount of cleaning:  Standard   Visualized foreign bodies/material removed: no     Debridement:  None   Undermining:  None Skin repair:    Repair method:  Tissue adhesive Approximation:    Approximation:  Close Repair type:    Repair type:  Simple Post-procedure details:    Dressing:  Open (no dressing)   Procedure completion:  Tolerated (including critical care time)  Medications Ordered in UC Medications - No data to display  Initial Impression / Assessment and Plan / UC  Course  I have reviewed the triage vital signs and the nursing notes.  Pertinent labs & imaging results that were  available during my care of the patient were reviewed by me and considered in my medical decision making (see chart for details).   Laceration, initial encounter  Wound approximated with tissue adhesive tape in a.m. wound has already begun to heal, discussed with patient of infection with strict precautions to return urgent care if any occur, naproxen 500 mg twice daily prescribed, recommended use for the next 3 to 5 days to help reduce swelling, may apply ice in 15-minute intervals directly to the affected area Final Clinical Impressions(s) / UC Diagnoses   Final diagnoses:  Facial laceration, initial encounter     Discharge Instructions      Your lacerations have been closed as best they could with skin glue, after few days this may fall off which is okay, if area has not begin to completely close you may attempt to close again with liquid Band-Aid adhesive or you can leave open either way it will continue to heal and close on its own  Watch for signs of infection such as increased pain, tenderness, swelling, drainage, fever, chills, it is low risk that this will occur  You may continue to wash face as normal, try to avoid scrubbing in the affected areas  You may apply ice over the next 24 hours and 15-minute intervals to help reduce swelling  I have prescribed for you naproxen which I recommend you taking twice a day for the next 3 to 5 days to help reduce swelling and for comfort   ED Prescriptions     Medication Sig Dispense Auth. Provider   naproxen (NAPROSYN) 500 MG tablet Take 1 tablet (500 mg total) by mouth 2 (two) times daily. 30 tablet Hans Eden, NP      PDMP not reviewed this encounter.   Hans Eden, NP 03/31/21 1104

## 2021-07-31 ENCOUNTER — Ambulatory Visit (INDEPENDENT_AMBULATORY_CARE_PROVIDER_SITE_OTHER): Payer: Medicaid Other

## 2021-07-31 DIAGNOSIS — N912 Amenorrhea, unspecified: Secondary | ICD-10-CM

## 2021-07-31 LAB — POCT URINE PREGNANCY: Preg Test, Ur: POSITIVE — AB

## 2021-07-31 MED ORDER — BLOOD PRESSURE KIT DEVI: 1.0000 | 0 refills | Status: AC | PRN

## 2021-07-31 MED ORDER — VITAFOL GUMMIES 3.33-0.333-34.8 MG PO CHEW: 1.0000 | CHEWABLE_TABLET | Freq: Every day | ORAL | 12 refills | Status: DC

## 2021-07-31 MED ORDER — GOJJI WEIGHT SCALE MISC: 1.0000 | 0 refills | Status: AC | PRN

## 2021-07-31 MED ORDER — VITAFOL GUMMIES 3.33-0.333-34.8 MG PO CHEW: 3.0000 | CHEWABLE_TABLET | Freq: Every day | ORAL | 12 refills | Status: AC

## 2021-07-31 NOTE — Progress Notes (Signed)
Ms. Forster presents today for UPT. She has no unusual complaints. ? ?LMP: 06/26/21 ?   ?OBJECTIVE: Appears well, in no apparent distress.  ?OB History   ? ? Gravida  ?3  ? Para  ?1  ? Term  ?   ? Preterm  ?1  ? AB  ?1  ? Living  ?2  ?  ? ? SAB  ?   ? IAB  ?   ? Ectopic  ?   ? Multiple  ?1  ? Live Births  ?2  ?   ?  ?  ? ?Home UPT Result: Positive  ?In-Office UPT result: Positive  ?I have reviewed the patient's medical, obstetrical, social, and family histories, and medications.  ? ?ASSESSMENT: Positive pregnancy test ? ?PLAN ?Prenatal care to be completed at:  ?[facility]  ?Start prenatal vitamins ?Pick up scale and BP cuff from [facility] ?Schedule NOB u/s and NOB provider visit   ?

## 2021-08-01 NOTE — Progress Notes (Signed)
Patient was assessed and managed by nursing staff during this encounter. I have reviewed the chart and agree with the documentation and plan. I have also made any necessary editorial changes. ? ?Catalina Antigua, MD ?08/01/2021 1:54 PM  ? ?

## 2021-08-15 ENCOUNTER — Other Ambulatory Visit: Payer: Self-pay | Admitting: *Deleted

## 2021-08-15 MED ORDER — PROMETHAZINE HCL 25 MG PO TABS: 25.0000 mg | ORAL_TABLET | Freq: Four times a day (QID) | ORAL | 1 refills | Status: DC | PRN

## 2021-08-15 NOTE — Progress Notes (Signed)
Pt called to office needing something for nausea, Phenergan sent per protocol ?Pt advised to try Gatorade or pedialyte, if unable to keep fluids down she should be seen for eval/possible IV fluids.  ? ?

## 2021-08-28 ENCOUNTER — Ambulatory Visit (INDEPENDENT_AMBULATORY_CARE_PROVIDER_SITE_OTHER): Payer: Medicaid Other

## 2021-08-28 VITALS — BP 121/75 | HR 89 | Ht 67.0 in | Wt 130.0 lb

## 2021-08-28 DIAGNOSIS — O3680X0 Pregnancy with inconclusive fetal viability, not applicable or unspecified: Secondary | ICD-10-CM

## 2021-08-28 DIAGNOSIS — Z3481 Encounter for supervision of other normal pregnancy, first trimester: Secondary | ICD-10-CM | POA: Diagnosis not present

## 2021-08-28 DIAGNOSIS — Z3490 Encounter for supervision of normal pregnancy, unspecified, unspecified trimester: Secondary | ICD-10-CM | POA: Insufficient documentation

## 2021-08-28 DIAGNOSIS — Z3491 Encounter for supervision of normal pregnancy, unspecified, first trimester: Secondary | ICD-10-CM | POA: Diagnosis not present

## 2021-08-28 DIAGNOSIS — Z3A09 9 weeks gestation of pregnancy: Secondary | ICD-10-CM

## 2021-08-28 DIAGNOSIS — O219 Vomiting of pregnancy, unspecified: Secondary | ICD-10-CM

## 2021-08-28 MED ORDER — DICLEGIS 10-10 MG PO TBEC: 2.0000 | DELAYED_RELEASE_TABLET | Freq: Every day | ORAL | 5 refills | Status: DC

## 2021-08-28 NOTE — Progress Notes (Signed)
New OB Intake ? ?I connected with  Brooke Vance on 08/28/21 at  3:10 PM EDT by in person and verified that I am speaking with the correct person using two identifiers. Nurse is located at [facility] and pt is located at [place]. ? ?I discussed the limitations, risks, security and privacy concerns of performing an evaluation and management service by telephone and the availability of in person appointments. I also discussed with the patient that there may be a patient responsible charge related to this service. The patient expressed understanding and agreed to proceed. ? ?I explained I am completing New OB Intake today. We discussed her EDD of 04/02/22 that is based on LMP of 06/26/21. Pt is G4/P0122. I reviewed her allergies, medications, Medical/Surgical/OB history, and appropriate screenings. I informed her of [facility] services. Based on history, this is a/an  pregnancy uncomplicated .  ? ?Patient Active Problem List  ? Diagnosis Date Noted  ? Headache 09/25/2019  ? Swollen upper lip 09/25/2019  ? Eye bruise 09/25/2019  ? Vaginal delivery--twins 09/30/2016  ? Dichorionic diamniotic twin pregnancy in third trimester 09/24/2016  ? Premature cervical dilation 08/08/2016  ? ? ?Concerns addressed today ? ?Delivery Plans:  ?Plans to deliver at [facility].  ? ?MyChart/Babyscripts ?MyChart access verified. I explained pt will have some visits in office and some virtually. Babyscripts instructions given and order placed. Patient verifies receipt of registration text/e-mail. Account successfully created and app downloaded. ? ?Blood Pressure Cuff  ?Blood pressure cuff ordered for patient to pick-up from [employer]. Explained after first prenatal appt pt will check weekly and document in Babyscripts. ? ?Weight scale: Patient does have weight scale. Weight scale ordered for patient to pick up from [employer].  ? ?Anatomy US ?Explained first scheduled [place] will be around 19 weeks. Dating and viability performed  today. ? ?Labs ?Discussed Avelina Laine genetic screening with patient. Would like both Panorama and Horizon drawn at new OB visit.Also if interested in genetic testing, tell patient she will need AFP 15-21 weeks to complete genetic testing .Routine prenatal labs needed. ? ?Covid Vaccine ?Patient has not covid vaccine.  ? ?Is patient a CenteringPregnancy candidate?  ?Declined ?Declined due to Group Setting ?Not a candidate due to Other  Is patient a CenteringPregnancy candidate? Declined due to Not interested  "Centering Patient" indicated on sticky note ?  ?Is patient interested in [place]?  ?No  "Interested in [facility] - Schedule next visit with CNM" on sticky note ? ?Informed patient of [facility] website  and placed link in her AVS.  ? ?Social Determinants of Health ?Food Insecurity: Patient denies food insecurity. ?WIC Referral: Patient is interested in referral to [facility].  ?Transportation: Patient denies transportation needs. ?Childcare: Discussed no children allowed at ultrasound appointments. Offered childcare services; patient declines childcare services at this time. ? ?Send link to Pregnancy Navigators ? ? ?Placed OB Box on problem list and updated ? ?First visit review ?I reviewed new OB appt with pt. I explained she will have a pelvic exam, ob bloodwork with genetic screening, and PAP smear. Explained pt will be seen by Peggy Constant at first visit; encounter routed to appropriate provider. Explained that patient will be seen by pregnancy navigator following visit with provider. [facility] information placed in AVS.  ? ?Hamilton Capri, RN ?08/28/2021  3:18 PM  ?

## 2021-08-29 ENCOUNTER — Other Ambulatory Visit: Payer: Self-pay

## 2021-08-29 ENCOUNTER — Inpatient Hospital Stay (HOSPITAL_COMMUNITY)
Admission: AD | Admit: 2021-08-29 | Discharge: 2021-08-29 | Disposition: A | Discharge status: Home / Self Care | Payer: Medicaid Other | Attending: Obstetrics & Gynecology | Admitting: Obstetrics & Gynecology

## 2021-08-29 ENCOUNTER — Telehealth: Payer: Self-pay | Admitting: *Deleted

## 2021-08-29 ENCOUNTER — Other Ambulatory Visit: Payer: Self-pay | Admitting: *Deleted

## 2021-08-29 ENCOUNTER — Encounter (HOSPITAL_COMMUNITY): Payer: Self-pay | Admitting: Obstetrics & Gynecology

## 2021-08-29 DIAGNOSIS — O210 Mild hyperemesis gravidarum: Secondary | ICD-10-CM | POA: Insufficient documentation

## 2021-08-29 DIAGNOSIS — Z3481 Encounter for supervision of other normal pregnancy, first trimester: Secondary | ICD-10-CM

## 2021-08-29 DIAGNOSIS — F12188 Cannabis abuse with other cannabis-induced disorder: Secondary | ICD-10-CM

## 2021-08-29 DIAGNOSIS — O219 Vomiting of pregnancy, unspecified: Secondary | ICD-10-CM

## 2021-08-29 DIAGNOSIS — Z3A09 9 weeks gestation of pregnancy: Secondary | ICD-10-CM | POA: Diagnosis not present

## 2021-08-29 DIAGNOSIS — R824 Acetonuria: Secondary | ICD-10-CM

## 2021-08-29 LAB — URINALYSIS, ROUTINE W REFLEX MICROSCOPIC
Bilirubin Urine: NEGATIVE
Glucose, UA: NEGATIVE mg/dL
Hgb urine dipstick: NEGATIVE
Ketones, ur: 80 mg/dL — AB
Leukocytes,Ua: NEGATIVE
Nitrite: NEGATIVE
Protein, ur: 30 mg/dL — AB
Specific Gravity, Urine: 1.023 (ref 1.005–1.030)
pH: 7 (ref 5.0–8.0)

## 2021-08-29 LAB — CBC
HCT: 34.9 % — ABNORMAL LOW (ref 36.0–46.0)
Hemoglobin: 12.1 g/dL (ref 12.0–15.0)
MCH: 31.3 pg (ref 26.0–34.0)
MCHC: 34.7 g/dL (ref 30.0–36.0)
MCV: 90.4 fL (ref 80.0–100.0)
Platelets: 252 10*3/uL (ref 150–400)
RBC: 3.86 MIL/uL — ABNORMAL LOW (ref 3.87–5.11)
RDW: 12.9 % (ref 11.5–15.5)
WBC: 5.1 10*3/uL (ref 4.0–10.5)
nRBC: 0 % (ref 0.0–0.2)

## 2021-08-29 LAB — BASIC METABOLIC PANEL
Anion gap: 9 (ref 5–15)
BUN: 9 mg/dL (ref 6–20)
CO2: 22 mmol/L (ref 22–32)
Calcium: 10.5 mg/dL — ABNORMAL HIGH (ref 8.9–10.3)
Chloride: 105 mmol/L (ref 98–111)
Creatinine, Ser: 0.59 mg/dL (ref 0.44–1.00)
GFR, Estimated: >60 mL/min (ref >60)
Glucose, Bld: 94 mg/dL (ref 70–99)
Potassium: 3.8 mmol/L (ref 3.5–5.1)
Sodium: 136 mmol/L (ref 135–145)

## 2021-08-29 MED ORDER — DIPHENHYDRAMINE HCL 50 MG/ML IJ SOLN
25.0000 mg | Freq: Once | INTRAMUSCULAR | Status: AC
  Administered 2021-08-29: 25 mg via INTRAVENOUS
  Filled 2021-08-29: qty 1

## 2021-08-29 MED ORDER — PROMETHAZINE HCL 25 MG PO TABS: 25.0000 mg | ORAL_TABLET | Freq: Four times a day (QID) | ORAL | 2 refills | Status: DC | PRN

## 2021-08-29 MED ORDER — HALOPERIDOL LACTATE 5 MG/ML IJ SOLN
2.0000 mg | Freq: Once | INTRAMUSCULAR | Status: AC
  Administered 2021-08-29: 2 mg via INTRAVENOUS
  Filled 2021-08-29: qty 0.4

## 2021-08-29 MED ORDER — ONDANSETRON HCL 4 MG/2ML IJ SOLN
4.0000 mg | Freq: Once | INTRAMUSCULAR | Status: AC
  Administered 2021-08-29: 4 mg via INTRAVENOUS
  Filled 2021-08-29: qty 2

## 2021-08-29 MED ORDER — LACTATED RINGERS IV BOLUS
1000.0000 mL | Freq: Once | INTRAVENOUS | Status: AC
  Administered 2021-08-29: 1000 mL via INTRAVENOUS

## 2021-08-29 MED ORDER — DICLEGIS 10-10 MG PO TBEC: 2.0000 | DELAYED_RELEASE_TABLET | Freq: Every day | ORAL | 5 refills | Status: AC

## 2021-08-29 MED ORDER — M.V.I. ADULT IV INJ
Freq: Once | INTRAVENOUS | Status: AC
  Filled 2021-08-29: qty 10

## 2021-08-29 NOTE — MAU Provider Note (Signed)
?History  ?  ? ?CSN: [id number] ? ?Arrival date and time: 08/29/21 1344 ? ?Event Date/Time  ?First Provider Initiated Contact with Patient 08/29/21 1432   ?  ? ?Chief Complaint  ?Patient presents with  ? Nausea  ? ?HPI ?Brooke Vance is a 31 y.o. [phone] at 41w1dwho presents to MAU with chief complaint of nausea and vomiting. Patient estimates she has vomited six times today. She is unable to tolerate anything PO. She previously trialed Phenergan but believes it worsened her vomiting and so discontinued use. She has a prescription for Diclegis but it was out of stock at her pharmacy and the prescription was just submitted to an alternate location earlier today.  ? ?Patient reports THC use, most recent use last week. She denies abdominal pain, vaginal bleeding, dysuria, fever or recent illness. ? ?[facility] ? ?OB History   ? ? Gravida  ?4  ? Para  ?1  ? Term  ?   ? Preterm  ?1  ? AB  ?2  ? Living  ?2  ?  ? ? SAB  ?   ? IAB  ?2  ? Ectopic  ?   ? Multiple  ?1  ? Live Births  ?2  ?   ?  ?  ? ? ?Past Medical History:  ?Diagnosis Date  ? Vaginal Pap smear, abnormal   ? ? ?Past Surgical History:  ?Procedure Laterality Date  ? DILATION AND CURETTAGE OF UTERUS    ? abortion   ? THERAPEUTIC ABORTION    ? VAGINAL DELIVERY N/A 09/30/2016  ? Procedure: VAGINAL DELIVERY;  Surgeon: VThurnell Lose MD;  Location[place] [facility]  Service: Obstetrics;  Laterality: N/A;  ? ? ?Family History  ?Problem Relation Age of Onset  ? Breast cancer Mother 330 ?     BRCA negative. Doing fine  ? Hypertension Maternal Grandmother   ? Heart disease Paternal Grandmother   ? ? ?Social History  ? ?Tobacco Use  ? Smoking status: Former  ?  Packs/day: 1.00  ?  Types: Cigarettes  ?  Quit date: 07/31/2021  ?  Years since quitting: 0.0  ?  Passive exposure: Current  ? Smokeless tobacco: Never  ? Tobacco comments:  ?  Last cigarette 07/30/21 - pt agrees to discontinue with +UPT   ?Vaping Use  ? Vaping  Use: Never used  ?Substance Use Topics  ? Alcohol use: Not Currently  ?  Alcohol/week: 4.0 standard drinks  ?  Types: 4 Standard drinks or equivalent per week  ?  Comment: stopped when pregnancy confirmed  ? Drug use: Not Currently  ?  Types: Marijuana  ?  Comment: 3-4 times per week  ? ? ?Allergies:  ?Allergies  ?Allergen Reactions  ? Other   ?  Seasonal Pollen.  ? ? ?Medications Prior to Admission  ?Medication Sig Dispense Refill Last Dose  ? Prenatal Vit-Fe Phos-FA-Omega (VITAFOL GUMMIES) 3.33-0.333-34.8 MG CHEW Chew 3 tablets by mouth daily. 90 tablet 12 08/28/2021  ? promethazine (PHENERGAN) 25 MG tablet Take 1 tablet (25 mg total) by mouth every 6 (six) hours as needed for nausea. 30 tablet 1 08/28/2021  ? acetaminophen (TYLENOL) 500 MG tablet Take 500 mg by mouth as needed. (Patient not taking: Reported on 07/31/2021)     ? Blood Pressure Monitoring (BLOOD PRESSURE KIT) DEVI 1 Device by Does not apply route as needed. 1 each 0   ? DICLEGIS 10-10 MG TBEC Take  2 tablets by mouth at bedtime. If symptoms persist, add one tablet in the morning and one in the afternoon 100 tablet 5   ? ibuprofen (ADVIL) 600 MG tablet Take 600 mg by mouth as needed.     ? Misc. Devices (GOJJI WEIGHT SCALE) MISC 1 Device by Does not apply route as needed. 1 each 0   ? naproxen (NAPROSYN) 500 MG tablet Take 1 tablet (500 mg total) by mouth 2 (two) times daily. 30 tablet 0   ? ondansetron (ZOFRAN ODT) 4 MG disintegrating tablet Take 1 tablet (4 mg total) by mouth every 8 (eight) hours as needed for nausea or vomiting. 20 tablet 0   ? ? ?Review of Systems  ?Constitutional:  Positive for fatigue.  ?Gastrointestinal:  Positive for nausea and vomiting.  ?All other systems reviewed and are negative. ?Physical Exam  ? ?Blood pressure (!) 118/93, pulse 88, temperature 98.6 ?F (37 ?C), temperature source Oral, resp. rate 16, height 5' 7"  (1.702 m), weight 58.4 kg, last menstrual period 06/26/2021, SpO2 98 %. ? ?Physical Exam ?Vitals and nursing  note reviewed. Exam conducted with a chaperone present.  ?Constitutional:   ?   Appearance: Normal appearance.  ?Cardiovascular:  ?   Rate and Rhythm: Normal rate and regular rhythm.  ?   Pulses: Normal pulses.  ?   Heart sounds: Normal heart sounds.  ?Pulmonary:  ?   Effort: Pulmonary effort is normal.  ?   Breath sounds: Normal breath sounds.  ?Abdominal:  ?   General: Abdomen is flat. Bowel sounds are normal.  ?Skin: ?   Capillary Refill: Capillary refill takes less than 2 seconds.  ?Neurological:  ?   Mental Status: She is alert and oriented to person, place, and time.  ?Psychiatric:     ?   Mood and Affect: Mood normal.     ?   Behavior: Behavior normal.     ?   Thought Content: Thought content normal.     ?   Judgment: Judgment normal.  ? ? ?MAU Course  ?Procedures ? ?MDM ? ?--Vomiting with ketonuria. Not currently using antiemetics. Weight relatively stable 60.3 kg on 07/31/21 >58.4 kg today ? ?--1545: CNM returned to bedside, ketonuria and indication for MVI bag reviewed. ? ?--1711: CNME returned to bedside. Patient dozing, reports mild nausea. MVI bag with about 800 mL left to infuse. Will plan to give IV Zofran prior to PO challenge ? ?--Diet currently includes raw fruit, large volumes of water and carbonated liquids. Discussed revising diet, bland simple solids, solid food before liquid, slow paced eating to reduce episodes of vomiting. ? ?Orders Placed This Encounter  ?Procedures  ? Urinalysis, Routine w reflex microscopic Urine, Clean Catch  ? CBC  ? Basic metabolic panel  ? Insert peripheral IV  ? ?Patient Vitals for the past 24 hrs: ? BP Temp Temp src Pulse Resp SpO2 Height Weight  ?08/29/21 1837 (!) 106/58 -- -- 86 18 -- -- --  ?08/29/21 1407 (!) 118/93 98.6 ?F (37 ?C) Oral 88 16 98 % -- --  ?08/29/21 1403 -- -- -- -- -- -- 5' 7"  (1.702 m) 58.4 kg  ? ?Results for orders placed or performed during the hospital encounter of 08/29/21 (from the past 24 hour(s))  ?Urinalysis, Routine w reflex microscopic  Urine, Clean Catch     Status: Abnormal  ? Collection Time: 08/29/21  2:20 PM  ?Result Value Ref Range  ? Color, Urine YELLOW YELLOW  ? APPearance CLOUDY (A) CLEAR  ?  Specific Gravity, Urine 1.023 1.005 - 1.030  ? pH 7.0 5.0 - 8.0  ? Glucose, UA NEGATIVE NEGATIVE mg/dL  ? Hgb urine dipstick NEGATIVE NEGATIVE  ? Bilirubin Urine NEGATIVE NEGATIVE  ? Ketones, ur 80 (A) NEGATIVE mg/dL  ? Protein, ur 30 (A) NEGATIVE mg/dL  ? Nitrite NEGATIVE NEGATIVE  ? Leukocytes,Ua NEGATIVE NEGATIVE  ? RBC / HPF 0-5 0 - 5 RBC/hpf  ? WBC, UA 0-5 0 - 5 WBC/hpf  ? Bacteria, UA FEW (A) NONE SEEN  ? Squamous Epithelial / LPF 6-10 0 - 5  ? Mucus PRESENT   ?CBC     Status: Abnormal  ? Collection Time: 08/29/21  2:55 PM  ?Result Value Ref Range  ? WBC 5.1 4.0 - 10.5 K/uL  ? RBC 3.86 (L) 3.87 - 5.11 MIL/uL  ? Hemoglobin 12.1 12.0 - 15.0 g/dL  ? HCT 34.9 (L) 36.0 - 46.0 %  ? MCV 90.4 80.0 - 100.0 fL  ? MCH 31.3 26.0 - 34.0 pg  ? MCHC 34.7 30.0 - 36.0 g/dL  ? RDW 12.9 11.5 - 15.5 %  ? Platelets 252 150 - 400 K/uL  ? nRBC 0.0 0.0 - 0.2 %  ?Basic metabolic panel     Status: Abnormal  ? Collection Time: 08/29/21  2:55 PM  ?Result Value Ref Range  ? Sodium 136 135 - 145 mmol/L  ? Potassium 3.8 3.5 - 5.1 mmol/L  ? Chloride 105 98 - 111 mmol/L  ? CO2 22 22 - 32 mmol/L  ? Glucose, Bld 94 70 - 99 mg/dL  ? BUN 9 6 - 20 mg/dL  ? Creatinine, Ser 0.59 0.44 - 1.00 mg/dL  ? Calcium 10.5 (H) 8.9 - 10.3 mg/dL  ? GFR, Estimated >60 >60 mL/min  ? Anion gap 9 5 - 15  ? ?Meds ordered this encounter  ?Medications  ? diphenhydrAMINE (BENADRYL) injection 25 mg  ? haloperidol lactate (HALDOL) injection 2 mg  ?  Cannabis Hyperemesis  ? lactated ringers bolus 1,000 mL  ? M.V.I. Adult (INFUVITE ADULT) 10 mL in lactated ringers 1,000 mL infusion  ? ondansetron (ZOFRAN) injection 4 mg  ? ?Assessment and Plan  ?--31 y.o. [id number] at [redacted]w[redacted]d ?--Cannabis Hyperemesis with Ketonuria ?--THC abstinence encouraged ?--S/p IV hydration ?--No episodes of vomiting in MAU ?--Tolerating PO  solid and liquid prior to discharge ?--Existing prescriptions for Diclegis and Phenergan from outside Provider ?--Discharge home in stable condition  ? ?SDarlina Rumpf MSA, MSN, CNM ?08/29/2021, 6

## 2021-08-29 NOTE — Telephone Encounter (Signed)
Pt returned TC requesting Diclegis be sent to [facility] on [address]. Reports [facility] does not have med in stock. RX sent. Pt also reports unable to tolerate food or fluid since yesterday's appt. Advised seeking care in MAU for hydration and nausea/ vomiting control as she will likely be unable to keep meds down at this point. ?

## 2021-08-29 NOTE — Telephone Encounter (Signed)
Returned TC to pt regarding "switching a prescription". Clarification needed. No answer. Left HIPPA compliant VM. ?

## 2021-08-29 NOTE — MAU Note (Signed)
Brooke Vance is a 31 y.o. at [redacted]w[redacted]d here in MAU reporting: has been "really sick". More than 6 episodes of emesis per patient.  ? ?Onset of complaint: ongoing ? ?Pain score: 0/10 ? ?Vitals:  ? 08/29/21 1407  ?BP: (!) 118/93  ?Pulse: 88  ?Resp: 16  ?Temp: 98.6 ?F (37 ?C)  ?SpO2: 98%  ?   ?Lab orders placed from triage: UA ? ?

## 2021-08-29 NOTE — Discharge Instructions (Signed)
Vomiting in First Trimester Follow these instructions at home: To help relieve your symptoms, listen to your body. Everyone is different and has different preferences. Find what works best for you. Here are some things you can try to help relieve your symptoms: Meals and snacks Eat 5-6 small meals daily instead of 3 large meals. Eating small meals and snacks can help you avoid an empty stomach. Before getting out of bed, eat a couple of crackers to avoid moving around on an empty stomach. Eat a protein-rich snack before bed. Examples include cheese and crackers, or a peanut butter sandwich made with 1 slice of whole-wheat bread and 1 tsp (5 g) of peanut butter. Eat and drink slowly. Try eating starchy foods as these are usually tolerated well. Examples include cereal, toast, bread, potatoes, pasta, rice, and pretzels. Eat at least one serving of protein with your meals and snacks. Protein options include lean meats, poultry, seafood, beans, nuts, nut butters, eggs, cheese, and yogurt. Eat or suck on things that have ginger in them. It may help to relieve nausea. Add  tsp (0.44 g) ground ginger to hot tea, or choose ginger tea.   Fluids It is important to stay hydrated. Try to: Drink small amounts of fluids often. Drink fluids 30 minutes before or after a meal to help lessen the feeling of a full stomach. Drink 100% fruit juice or an electrolyte drink. An electrolyte drink contains sodium, potassium, and chloride. Drink fluids that are cold, clear, and carbonated or sour. These include lemonade, ginger ale, lemon-lime soda, ice water, and sparkling water. Things to avoid Avoid the following: Eating foods that trigger your symptoms. These may include spicy foods, coffee, high-fat foods, very sweet foods, and acidic foods. Drinking more than 1 cup of fluid at a time. Skipping meals. Nausea can be more intense on an empty stomach. If you cannot tolerate food, do not force it. Try sucking on ice  chips or other frozen items and make up for missed calories later. Lying down within 2 hours after eating. Being exposed to environmental triggers. These may include food smells, smoky rooms, closed spaces, rooms with strong smells, warm or humid places, overly loud and noisy rooms, and rooms with motion or flickering lights. Try eating meals in a well-ventilated area that is free of strong smells. Making quick and sudden changes in your movement. Taking iron pills and multivitamins that contain iron. If you take prescription iron pills, do not stop taking them unless your health care provider approves. Preparing food. The smell of food can spoil your appetite or trigger nausea. General instructions Brush your teeth or use a mouth rinse after meals. Take over-the-counter and prescription medicines only as told by your health care provider. Follow instructions from your health care provider about eating or drinking restrictions. Talk with your health care provider about starting a supplement of vitamin B6. Continue to take your prenatal vitamins as told by your health care provider. If you are having trouble taking your prenatal vitamins, talk with your health care provider about other options. Keep all follow-up visits. This is important. Follow-up visits include prenatal visits. Contact a health care provider if: You have pain in your abdomen. You have a severe headache. You have vision problems. You are losing weight. You feel weak or dizzy. You cannot eat or drink without vomiting, especially if this goes on for a full day. Get help right away if: You cannot drink fluids without vomiting. You vomit blood. You have constant   nausea and vomiting. You are very weak. You faint. You have a fever and your symptoms suddenly get worse. Summary Making some changes to your eating habits may help relieve nausea and vomiting. This condition may be managed with lifestyle changes and medicines as  prescribed by your health care provider. If medicines do not help relieve nausea and vomiting, you may need to receive fluids through an IV at the hospital. This information is not intended to replace advice given to you by your health care provider. Make sure you discuss any questions you have with your health care provider. Document Revised: 10/26/2019 Document Reviewed: 10/26/2019 Elsevier Patient Education  2021 Elsevier Inc.  

## 2021-09-18 ENCOUNTER — Ambulatory Visit (INDEPENDENT_AMBULATORY_CARE_PROVIDER_SITE_OTHER): Payer: Medicaid Other | Admitting: Obstetrics and Gynecology

## 2021-09-18 ENCOUNTER — Other Ambulatory Visit (HOSPITAL_COMMUNITY)
Admission: RE | Admit: 2021-09-18 | Discharge: 2021-09-18 | Disposition: A | Discharge status: Home / Self Care | Payer: Medicaid Other | Source: Ambulatory Visit | Attending: Obstetrics and Gynecology | Admitting: Obstetrics and Gynecology

## 2021-09-18 ENCOUNTER — Encounter: Payer: Self-pay | Admitting: Obstetrics and Gynecology

## 2021-09-18 VITALS — BP 108/77 | HR 105 | Wt 131.0 lb

## 2021-09-18 DIAGNOSIS — Z3481 Encounter for supervision of other normal pregnancy, first trimester: Secondary | ICD-10-CM | POA: Insufficient documentation

## 2021-09-18 DIAGNOSIS — Z3A12 12 weeks gestation of pregnancy: Secondary | ICD-10-CM

## 2021-09-18 MED ORDER — PROMETHAZINE HCL 25 MG PO TABS: 25.0000 mg | ORAL_TABLET | Freq: Four times a day (QID) | ORAL | 2 refills | Status: AC | PRN

## 2021-09-18 NOTE — Patient Instructions (Signed)
First Trimester of Pregnancy  The first trimester of pregnancy starts on the first day of your last menstrual period until the end of week 12. This is months 1 through 3 of pregnancy. A week after a sperm fertilizes an egg, the egg will implant into the wall of the uterus and begin to develop into a baby. By the end of 12 weeks, all the baby's organs will be formed and the baby will be 2-3 inches in size. Body changes during your first trimester Your body goes through many changes during pregnancy. The changes vary and generally return to normal after your baby is born. Physical changes You may gain or lose weight. Your breasts may begin to grow larger and become tender. The tissue that surrounds your nipples (areola) may become darker. Dark spots or blotches (chloasma or mask of pregnancy) may develop on your face. You may have changes in your hair. These can include thickening or thinning of your hair or changes in texture. Health changes You may feel nauseous, and you may vomit. You may have heartburn. You may develop headaches. You may develop constipation. Your gums may bleed and may be sensitive to brushing and flossing. Other changes You may tire easily. You may urinate more often. Your menstrual periods will stop. You may have a loss of appetite. You may develop cravings for certain kinds of food. You may have changes in your emotions from day to day. You may have more vivid and strange dreams. Follow these instructions at home: Medicines Follow your health care provider's instructions regarding medicine use. Specific medicines may be either safe or unsafe to take during pregnancy. Do not take any medicines unless told to by your health care provider. Take a prenatal vitamin that contains at least 600 micrograms (mcg) of folic acid. Eating and drinking Eat a healthy diet that includes fresh fruits and vegetables, whole grains, good sources of protein such as meat, eggs, or tofu,  and low-fat dairy products. Avoid raw meat and unpasteurized juice, milk, and cheese. These carry germs that can harm you and your baby. If you feel nauseous or you vomit: Eat 4 or 5 small meals a day instead of 3 large meals. Try eating a few soda crackers. Drink liquids between meals instead of during meals. You may need to take these actions to prevent or treat constipation: Drink enough fluid to keep your urine pale yellow. Eat foods that are high in fiber, such as beans, whole grains, and fresh fruits and vegetables. Limit foods that are high in fat and processed sugars, such as fried or sweet foods. Activity Exercise only as directed by your health care provider. Most people can continue their usual exercise routine during pregnancy. Try to exercise for 30 minutes at least 5 days a week. Stop exercising if you develop pain or cramping in the lower abdomen or lower back. Avoid exercising if it is very hot or humid or if you are at high altitude. Avoid heavy lifting. If you choose to, you may have sex unless your health care provider tells you not to. Relieving pain and discomfort Wear a good support bra to relieve breast tenderness. Rest with your legs elevated if you have leg cramps or low back pain. If you develop bulging veins (varicose veins) in your legs: Wear support hose as told by your health care provider. Elevate your feet for 15 minutes, 3-4 times a day. Limit salt in your diet. Safety Wear your seat belt at all times when   driving or riding in a car. Talk with your health care provider if someone is verbally or physically abusive to you. Talk with your health care provider if you are feeling sad or have thoughts of hurting yourself. Lifestyle Do not use hot tubs, steam rooms, or saunas. Do not douche. Do not use tampons or scented sanitary pads. Do not use herbal remedies, alcohol, illegal drugs, or medicines that are not approved by your health care provider. Chemicals  in these products can harm your baby. Do not use any products that contain nicotine or tobacco, such as cigarettes, e-cigarettes, and chewing tobacco. If you need help quitting, ask your health care provider. Avoid cat litter boxes and soil used by cats. These carry germs that can cause birth defects in the baby and possibly loss of the unborn baby (fetus) by miscarriage or stillbirth. General instructions During routine prenatal visits in the first trimester, your health care provider will do a physical exam, perform necessary tests, and ask you how things are going. Keep all follow-up visits. This is important. Ask for help if you have counseling or nutritional needs during pregnancy. Your health care provider can offer advice or refer you to specialists for help with various needs. Schedule a dentist appointment. At home, brush your teeth with a soft toothbrush. Floss gently. Write down your questions. Take them to your prenatal visits. Where to find more information American Pregnancy Association: americanpregnancy.[place] and Gynecologists: PoolDevices.com.pt Office on [employer] Health: KeywordPortfolios.com.br Contact a health care provider if you have: Dizziness. A fever. Mild pelvic cramps, pelvic pressure, or nagging pain in the abdominal area. Nausea, vomiting, or diarrhea that lasts for 24 hours or longer. A bad-smelling vaginal discharge. Pain when you urinate. Known exposure to a contagious illness, such as chickenpox, measles, Zika virus, HIV, or hepatitis. Get help right away if you have: Spotting or bleeding from your vagina. Severe abdominal cramping or pain. Shortness of breath or chest pain. Any kind of trauma, such as from a fall or a car crash. New or increased pain, swelling, or redness in an arm or leg. Summary The first trimester of pregnancy starts on the first day of your last menstrual period until the end of week  12 (months 1 through 3). Eating 4 or 5 small meals a day rather than 3 large meals may help to relieve nausea and vomiting. Do not use any products that contain nicotine or tobacco, such as cigarettes, e-cigarettes, and chewing tobacco. If you need help quitting, ask your health care provider. Keep all follow-up visits. This is important. This information is not intended to replace advice given to you by your health care provider. Make sure you discuss any questions you have with your health care provider. Document Revised: 09/09/2019 Document Reviewed: 07/16/2019 Elsevier Patient Education  [place] of Pregnancy  The second trimester of pregnancy is from week 13 through week 27. This is months 4 through 6 of pregnancy. The second trimester is often a time when you feel your best. Your body has adjusted to being pregnant, and you begin to feel better physically. During the second trimester: Morning sickness has lessened or stopped completely. You may have more energy. You may have an increase in appetite. The second trimester is also a time when the unborn baby (fetus) is growing rapidly. At the end of the sixth month, the fetus may be up to 12 inches long and weigh about 1 pounds. You will likely  begin to feel the baby move (quickening) between 16 and 20 weeks of pregnancy. Body changes during your second trimester Your body continues to go through many changes during your second trimester. The changes vary and generally return to normal after the baby is born. Physical changes Your weight will continue to increase. You will notice your lower abdomen bulging out. You may begin to get stretch marks on your hips, abdomen, and breasts. Your breasts will continue to grow and to become tender. Dark spots or blotches (chloasma or mask of pregnancy) may develop on your face. A dark line from your belly button to the pubic area (linea nigra) may appear. You may have  changes in your hair. These can include thickening of your hair, rapid growth, and changes in texture. Some people also have hair loss during or after pregnancy, or hair that feels dry or thin. Health changes You may develop headaches. You may have heartburn. You may develop constipation. You may develop hemorrhoids or swollen, bulging veins (varicose veins). Your gums may bleed and may be sensitive to brushing and flossing. You may urinate more often because the fetus is pressing on your bladder. You may have back pain. This is caused by: Weight gain. Pregnancy hormones that are relaxing the joints in your pelvis. A shift in weight and the muscles that support your balance. Follow these instructions at home: Medicines Follow your health care provider's instructions regarding medicine use. Specific medicines may be either safe or unsafe to take during pregnancy. Do not take any medicines unless approved by your health care provider. Take a prenatal vitamin that contains at least 600 micrograms (mcg) of folic acid. Eating and drinking Eat a healthy diet that includes fresh fruits and vegetables, whole grains, good sources of protein such as meat, eggs, or tofu, and low-fat dairy products. Avoid raw meat and unpasteurized juice, milk, and cheese. These carry germs that can harm you and your baby. You may need to take these actions to prevent or treat constipation: Drink enough fluid to keep your urine pale yellow. Eat foods that are high in fiber, such as beans, whole grains, and fresh fruits and vegetables. Limit foods that are high in fat and processed sugars, such as fried or sweet foods. Activity Exercise only as directed by your health care provider. Most people can continue their usual exercise routine during pregnancy. Try to exercise for 30 minutes at least 5 days a week. Stop exercising if you develop contractions in your uterus. Stop exercising if you develop pain or cramping in the  lower abdomen or lower back. Avoid exercising if it is very hot or humid or if you are at a high altitude. Avoid heavy lifting. If you choose to, you may have sex unless your health care provider tells you not to. Relieving pain and discomfort Wear a supportive bra to prevent discomfort from breast tenderness. Take warm sitz baths to soothe any pain or discomfort caused by hemorrhoids. Use hemorrhoid cream if your health care provider approves. Rest with your legs raised (elevated) if you have leg cramps or low back pain. If you develop varicose veins: Wear support hose as told by your health care provider. Elevate your feet for 15 minutes, 3-4 times a day. Limit salt in your diet. Safety Wear your seat belt at all times when driving or riding in a car. Talk with your health care provider if someone is verbally or physically abusive to you. Lifestyle Do not use hot tubs, steam rooms,  or saunas. Do not douche. Do not use tampons or scented sanitary pads. Avoid cat litter boxes and soil used by cats. These carry germs that can cause birth defects in the baby and possibly loss of the fetus by miscarriage or stillbirth. Do not use herbal remedies, alcohol, illegal drugs, or medicines that are not approved by your health care provider. Chemicals in these products can harm your baby. Do not use any products that contain nicotine or tobacco, such as cigarettes, e-cigarettes, and chewing tobacco. If you need help quitting, ask your health care provider. General instructions During a routine prenatal visit, your health care provider will do a physical exam and other tests. He or she will also discuss your overall health. Keep all follow-up visits. This is important. Ask your health care provider for a referral to a local prenatal education class. Ask for help if you have counseling or nutritional needs during pregnancy. Your health care provider can offer advice or refer you to specialists for help  with various needs. Where to find more information American Pregnancy Association: americanpregnancy.org American College of Obstetricians and Gynecologists: acog.org/en/Womens%20Health/Pregnancy Office on Women's Health: womenshealth.gov/pregnancy Contact a health care provider if you have: A headache that does not go away when you take medicine. Vision changes or you see spots in front of your eyes. Mild pelvic cramps, pelvic pressure, or nagging pain in the abdominal area. Persistent nausea, vomiting, or diarrhea. A bad-smelling vaginal discharge or foul-smelling urine. Pain when you urinate. Sudden or extreme swelling of your face, hands, ankles, feet, or legs. A fever. Get help right away if you: Have fluid leaking from your vagina. Have spotting or bleeding from your vagina. Have severe abdominal cramping or pain. Have difficulty breathing. Have chest pain. Have fainting spells. Have not felt your baby move for the time period told by your health care provider. Have new or increased pain, swelling, or redness in an arm or leg. Summary The second trimester of pregnancy is from week 13 through week 27 (months 4 through 6). Do not use herbal remedies, alcohol, illegal drugs, or medicines that are not approved by your health care provider. Chemicals in these products can harm your baby. Exercise only as directed by your health care provider. Most people can continue their usual exercise routine during pregnancy. Keep all follow-up visits. This is important. This information is not intended to replace advice given to you by your health care provider. Make sure you discuss any questions you have with your health care provider. Document Revised: 09/09/2019 Document Reviewed: 07/16/2019 Elsevier Patient Education  2023 Elsevier Inc.  

## 2021-09-18 NOTE — Progress Notes (Signed)
Subjective:    Brooke Vance is a 41 seen today for her first obstetrical visit.  Her obstetrical history is significant for previous twin vaginal delivery at 35w. Patient does intend to breast feed. Pregnancy history fully reviewed.  Patient reports no complaints.  Vitals:   09/18/21 0957  BP: 108/77  Pulse: (!) 105  Weight: 131 lb (59.4 kg)    HISTORY: OB History  Gravida Para Term Preterm AB Living  _0 SAB IAB Ectopic Multiple Live Births    _1 # Outcome Date GA Lbr Len/2nd Weight Sex Delivery Anes PTL Lv  4 Current           3A Preterm 09/30/16 [id number] / 02:01 5 lb 6.1 oz (2.441 kg) M Vag-Vacuum EPI  LIV     Birth Comments: preterm  3B Preterm 09/30/16 [id number]:11 / 02:22 5 lb 5.2 oz (2.415 kg) M Vag-Vacuum EPI  LIV  2 IAB           1 IAB            Past Medical History:  Diagnosis Date   Vaginal Pap smear, abnormal    Past Surgical History:  Procedure Laterality Date   DILATION AND CURETTAGE OF UTERUS     abortion    THERAPEUTIC ABORTION     VAGINAL DELIVERY N/A 09/30/2016   Procedure: VAGINAL DELIVERY;  Surgeon: VarThurnell LoseD;  [facility];  Laterality: N/A;   Family History  Problem Relation Age of Onset   Breast cancer Mother 35 68    BRCA negative. Doing fine   Hypertension Maternal Grandmother    Heart disease Paternal Grandmother      Exam    Uterus:     Pelvic Exam:    Perineum: Normal Perineum   Vulva: normal   Vagina:  normal mucosa, normal discharge   pH:    Cervix: multiparous appearance and cervix is closed and long   Adnexa: no mass, fullness, tenderness   Bony Pelvis: gynecoid  System: Breast:  normal appearance, no masses or tenderness   Skin: normal coloration and turgor, no rashes    Neurologic: oriented, no focal deficits   Extremities: normal strength, tone, and muscle mass   HEENT extra ocular movement intact   Mouth/Teeth mucous  membranes moist, pharynx normal without lesions   Neck supple and no masses   Cardiovascular: regular rate and rhythm   Respiratory:  appears well, vitals normal, no respiratory distress, acyanotic, normal RR, chest clear, no wheezing, crepitations, rhonchi, normal symmetric air entry   Abdomen: soft, non-tender; bowel sounds normal; no masses,  no organomegaly   Urinary:       Assessment:    Pregnancy: [id number] Active Problem List   Diagnosis Date Noted   Supervision of normal pregnancy 08/28/2021   Headache 09/25/2019   Swollen upper lip 09/25/2019   Eye bruise 09/25/2019   Vaginal delivery--twins 09/30/2016   Dichorionic diamniotic twin pregnancy in third trimester 09/24/2016   Premature cervical dilation 08/08/2016        Plan:     Initial labs drawn. Prenatal vitamins. Problem list reviewed and updated. Genetic Screening discussed : panorama ordered.  Ultrasound discussed; fetal survey: ordered.  Follow up in 4 weeks. 50% of 30 min visit spent on counseling and coordination of care.     Gaylynn Seiple 09/18/2021

## 2021-09-19 LAB — CBC/D/PLT+RPR+RH+ABO+RUBIGG...
Antibody Screen: NEGATIVE
Basophils Absolute: 0 10*3/uL (ref 0.0–0.2)
Basos: 0 %
EOS (ABSOLUTE): 0 10*3/uL (ref 0.0–0.4)
Eos: 1 %
HCV Ab: NONREACTIVE
HIV Screen 4th Generation wRfx: NONREACTIVE
Hematocrit: 35.4 % (ref 34.0–46.6)
Hemoglobin: 11.8 g/dL (ref 11.1–15.9)
Hepatitis B Surface Ag: NEGATIVE
Immature Grans (Abs): 0 10*3/uL (ref 0.0–0.1)
Immature Granulocytes: 0 %
Lymphocytes Absolute: 1.4 10*3/uL (ref 0.7–3.1)
Lymphs: 28 %
MCH: 31.1 pg (ref 26.6–33.0)
MCHC: 33.3 g/dL (ref 31.5–35.7)
MCV: 93 fL (ref 79–97)
Monocytes Absolute: 0.4 10*3/uL (ref 0.1–0.9)
Monocytes: 8 %
Neutrophils Absolute: 3.2 10*3/uL (ref 1.4–7.0)
Neutrophils: 63 %
Platelets: 239 10*3/uL (ref 150–450)
RBC: 3.79 x10E6/uL (ref 3.77–5.28)
RDW: 13.6 % (ref 11.7–15.4)
RPR Ser Ql: NONREACTIVE
Rh Factor: POSITIVE
Rubella Antibodies, IGG: 3.1 index (ref >0.99)
WBC: 5.1 10*3/uL (ref 3.4–10.8)

## 2021-09-19 LAB — HEMOGLOBIN A1C
Est. average glucose Bld gHb Est-mCnc: 111 mg/dL
Hgb A1c MFr Bld: 5.5 % (ref 4.8–5.6)

## 2021-09-19 LAB — CERVICOVAGINAL ANCILLARY ONLY
Chlamydia: NEGATIVE
Comment: NEGATIVE
Comment: NORMAL
Neisseria Gonorrhea: NEGATIVE

## 2021-09-19 LAB — CYTOLOGY - PAP
Comment: NEGATIVE
Diagnosis: NEGATIVE
High risk HPV: NEGATIVE

## 2021-09-19 LAB — HCV INTERPRETATION

## 2021-09-20 LAB — URINE CULTURE, OB REFLEX

## 2021-09-20 LAB — CULTURE, OB URINE

## 2021-09-25 ENCOUNTER — Encounter: Payer: Self-pay | Admitting: Obstetrics and Gynecology

## 2021-09-27 ENCOUNTER — Encounter: Payer: Self-pay | Admitting: Obstetrics and Gynecology

## 2021-10-16 ENCOUNTER — Encounter: Payer: Medicaid Other | Admitting: Obstetrics

## 2021-10-16 ENCOUNTER — Other Ambulatory Visit (HOSPITAL_COMMUNITY)
Admission: RE | Admit: 2021-10-16 | Discharge: 2021-10-16 | Disposition: A | Discharge status: Home / Self Care | Payer: Medicaid Other | Source: Ambulatory Visit | Attending: Obstetrics | Admitting: Obstetrics

## 2021-10-16 ENCOUNTER — Ambulatory Visit (INDEPENDENT_AMBULATORY_CARE_PROVIDER_SITE_OTHER): Payer: Medicaid Other | Admitting: Obstetrics and Gynecology

## 2021-10-16 VITALS — BP 116/75 | HR 94 | Wt 136.1 lb

## 2021-10-16 DIAGNOSIS — Z3482 Encounter for supervision of other normal pregnancy, second trimester: Secondary | ICD-10-CM | POA: Insufficient documentation

## 2021-10-16 DIAGNOSIS — Z3A16 16 weeks gestation of pregnancy: Secondary | ICD-10-CM | POA: Diagnosis not present

## 2021-10-16 NOTE — Progress Notes (Signed)
Pt states that she has not started feeling fetal movement yet. Reports pink spotting yesterday with urination and d/c.

## 2021-10-16 NOTE — Progress Notes (Signed)
   PRENATAL VISIT NOTE  Subjective:  Brooke Vance is a 31 y.o. [phone] at [redacted]w[redacted]d being seen today for ongoing prenatal care.  She is currently monitored for the following issues for this low-risk pregnancy and has Premature cervical dilation; Dichorionic diamniotic twin pregnancy in third trimester; Vaginal delivery--twins; Headache; Swollen upper lip; Eye bruise; and Supervision of normal pregnancy on their problem list.  Patient doing well with no acute concerns today. She reports  mild vaginal spotting, none seen today .  Contractions: Not present. Vag. Bleeding: None.   . Denies leaking of fluid.   The following portions of the patient's history were reviewed and updated as appropriate: allergies, current medications, past family history, past medical history, past social history, past surgical history and problem list. Problem list updated.  Objective:   Vitals:   10/16/21 1341  BP: 116/75  Pulse: 94  Weight: 136 lb 1.6 oz (61.7 kg)    Fetal Status: Fetal Heart Rate (bpm): 153 Fundal Height: 16 cm       General:  Alert, oriented and cooperative. Patient is in no acute distress.  Skin: Skin is warm and dry. No rash noted.   Cardiovascular: Normal heart rate noted  Respiratory: Normal respiratory effort, no problems with respiration noted  Abdomen: Soft, gravid, appropriate for gestational age.  Pain/Pressure: Absent     Pelvic: Cervical exam deferred        Extremities: Normal range of motion.  Edema: None  Mental Status:  Normal mood and affect. Normal behavior. Normal judgment and thought content.   Assessment and Plan:  Pregnancy: [id number] at [redacted]w[redacted]d  1. Encounter for supervision of other normal pregnancy in second trimester Continue routine prenatal care Pt reassured that pink discharge and/or spotting is relatively normal in pregnancy  - AFP, Serum, Open Spina Bifida - Cervicovaginal ancillary only( [place])  Preterm labor symptoms and general obstetric  precautions including but not limited to vaginal bleeding, contractions, leaking of fluid and fetal movement were reviewed in detail with the patient.  Please refer to After Visit Summary for other counseling recommendations.   Return in about 4 weeks (around 11/13/2021) for ROB, in person.   Mariel Aloe, MD Faculty Attending [facility]

## 2021-10-18 LAB — AFP, SERUM, OPEN SPINA BIFIDA
AFP MoM: 1.86
AFP Value: 75.4 ng/mL
Gest. Age on Collection Date: 16 weeks
Maternal Age At EDD: 31.3 yr
OSBR Risk 1 IN: 2217
Test Results:: NEGATIVE
Weight: 136 [lb_av]

## 2021-10-18 LAB — CERVICOVAGINAL ANCILLARY ONLY
Bacterial Vaginitis (gardnerella): NEGATIVE
Candida Glabrata: NEGATIVE
Candida Vaginitis: NEGATIVE
Chlamydia: NEGATIVE
Comment: NEGATIVE
Comment: NEGATIVE
Comment: NEGATIVE
Comment: NEGATIVE
Comment: NEGATIVE
Comment: NORMAL
Neisseria Gonorrhea: NEGATIVE
Trichomonas: NEGATIVE

## 2021-11-06 ENCOUNTER — Other Ambulatory Visit: Payer: Medicaid Other

## 2021-11-13 ENCOUNTER — Encounter: Payer: Medicaid Other | Admitting: Obstetrics & Gynecology

## 2021-11-13 ENCOUNTER — Other Ambulatory Visit: Payer: Medicaid Other

## 2021-12-08 ENCOUNTER — Ambulatory Visit: Payer: Medicaid Other | Attending: Obstetrics and Gynecology

## 2021-12-08 DIAGNOSIS — Z363 Encounter for antenatal screening for malformations: Secondary | ICD-10-CM | POA: Insufficient documentation

## 2021-12-08 DIAGNOSIS — O09292 Supervision of pregnancy with other poor reproductive or obstetric history, second trimester: Secondary | ICD-10-CM | POA: Insufficient documentation

## 2021-12-08 DIAGNOSIS — O358XX0 Maternal care for other (suspected) fetal abnormality and damage, not applicable or unspecified: Secondary | ICD-10-CM | POA: Insufficient documentation

## 2021-12-08 DIAGNOSIS — Z3481 Encounter for supervision of other normal pregnancy, first trimester: Secondary | ICD-10-CM

## 2021-12-08 DIAGNOSIS — Z3A23 23 weeks gestation of pregnancy: Secondary | ICD-10-CM | POA: Diagnosis not present

## 2021-12-08 DIAGNOSIS — O09212 Supervision of pregnancy with history of pre-term labor, second trimester: Secondary | ICD-10-CM | POA: Insufficient documentation

## 2022-03-27 ENCOUNTER — Telehealth: Payer: Self-pay

## 2022-03-27 NOTE — Telephone Encounter (Signed)
Call back patient
